# Patient Record
Sex: Female | Born: 1950
Health system: Southern US, Community
[De-identification: ages and names within clinical notes are randomized; demographics above are authoritative.]

## PROBLEM LIST (undated history)

## (undated) DIAGNOSIS — N183 Chronic kidney disease, stage 3 unspecified: Secondary | ICD-10-CM

## (undated) DIAGNOSIS — E079 Disorder of thyroid, unspecified: Secondary | ICD-10-CM

## (undated) DIAGNOSIS — L719 Rosacea, unspecified: Secondary | ICD-10-CM

## (undated) HISTORY — DX: Rosacea, unspecified: L71.9

## (undated) HISTORY — PX: OTHER SURGICAL HISTORY: SHX169

## (undated) HISTORY — DX: Chronic kidney disease, stage 3 (moderate): N18.3

## (undated) HISTORY — DX: Chronic kidney disease, stage 3 unspecified: N18.30

## (undated) HISTORY — PX: PILONIDAL CYST / SINUS EXCISION: SUR543

---

## 1998-04-07 ENCOUNTER — Ambulatory Visit (HOSPITAL_COMMUNITY): Admission: RE | Admit: 1998-04-07 | Discharge: 1998-04-07 | Payer: Self-pay | Admitting: Obstetrics and Gynecology

## 1998-04-07 ENCOUNTER — Other Ambulatory Visit: Admission: RE | Admit: 1998-04-07 | Discharge: 1998-04-07 | Payer: Self-pay | Admitting: Obstetrics and Gynecology

## 1998-08-10 ENCOUNTER — Other Ambulatory Visit: Admission: RE | Admit: 1998-08-10 | Discharge: 1998-08-10 | Payer: Self-pay | Admitting: Obstetrics and Gynecology

## 1999-02-13 ENCOUNTER — Other Ambulatory Visit: Admission: RE | Admit: 1999-02-13 | Discharge: 1999-02-13 | Payer: Self-pay | Admitting: Obstetrics and Gynecology

## 1999-06-14 ENCOUNTER — Ambulatory Visit (HOSPITAL_COMMUNITY): Admission: RE | Admit: 1999-06-14 | Discharge: 1999-06-14 | Payer: Self-pay | Admitting: Obstetrics and Gynecology

## 1999-06-14 ENCOUNTER — Encounter: Payer: Self-pay | Admitting: Obstetrics and Gynecology

## 2000-02-14 ENCOUNTER — Other Ambulatory Visit: Admission: RE | Admit: 2000-02-14 | Discharge: 2000-02-14 | Payer: Self-pay | Admitting: Obstetrics and Gynecology

## 2000-08-22 ENCOUNTER — Encounter: Payer: Self-pay | Admitting: Family Medicine

## 2000-08-22 ENCOUNTER — Ambulatory Visit (HOSPITAL_COMMUNITY): Admission: RE | Admit: 2000-08-22 | Discharge: 2000-08-22 | Payer: Self-pay | Admitting: Family Medicine

## 2001-08-27 ENCOUNTER — Encounter: Payer: Self-pay | Admitting: Family Medicine

## 2001-08-27 ENCOUNTER — Ambulatory Visit (HOSPITAL_COMMUNITY): Admission: RE | Admit: 2001-08-27 | Discharge: 2001-08-27 | Payer: Self-pay | Admitting: Family Medicine

## 2002-09-10 ENCOUNTER — Ambulatory Visit (HOSPITAL_COMMUNITY): Admission: RE | Admit: 2002-09-10 | Discharge: 2002-09-10 | Payer: Self-pay | Admitting: Family Medicine

## 2002-09-10 ENCOUNTER — Encounter: Payer: Self-pay | Admitting: Family Medicine

## 2003-07-07 ENCOUNTER — Other Ambulatory Visit: Admission: RE | Admit: 2003-07-07 | Discharge: 2003-07-07 | Payer: Self-pay | Admitting: Gynecology

## 2003-07-12 ENCOUNTER — Encounter: Admission: RE | Admit: 2003-07-12 | Discharge: 2003-07-12 | Payer: Self-pay | Admitting: Gynecology

## 2003-07-12 ENCOUNTER — Encounter: Payer: Self-pay | Admitting: Gynecology

## 2004-07-26 ENCOUNTER — Encounter: Admission: RE | Admit: 2004-07-26 | Discharge: 2004-07-26 | Payer: Self-pay | Admitting: Family Medicine

## 2004-08-15 ENCOUNTER — Other Ambulatory Visit: Admission: RE | Admit: 2004-08-15 | Discharge: 2004-08-15 | Payer: Self-pay | Admitting: Family Medicine

## 2004-09-04 ENCOUNTER — Ambulatory Visit (HOSPITAL_COMMUNITY): Admission: RE | Admit: 2004-09-04 | Discharge: 2004-09-04 | Payer: Self-pay | Admitting: Surgery

## 2004-09-04 ENCOUNTER — Encounter (INDEPENDENT_AMBULATORY_CARE_PROVIDER_SITE_OTHER): Payer: Self-pay | Admitting: Specialist

## 2004-11-16 ENCOUNTER — Encounter: Admission: RE | Admit: 2004-11-16 | Discharge: 2004-11-16 | Payer: Self-pay | Admitting: Family Medicine

## 2004-12-12 ENCOUNTER — Encounter (INDEPENDENT_AMBULATORY_CARE_PROVIDER_SITE_OTHER): Payer: Self-pay | Admitting: Specialist

## 2004-12-12 ENCOUNTER — Observation Stay (HOSPITAL_COMMUNITY): Admission: RE | Admit: 2004-12-12 | Discharge: 2004-12-13 | Payer: Self-pay | Admitting: Surgery

## 2005-07-19 ENCOUNTER — Other Ambulatory Visit: Admission: RE | Admit: 2005-07-19 | Discharge: 2005-07-19 | Payer: Self-pay | Admitting: Gynecology

## 2005-07-29 ENCOUNTER — Encounter: Admission: RE | Admit: 2005-07-29 | Discharge: 2005-07-29 | Payer: Self-pay | Admitting: Gynecology

## 2006-08-13 ENCOUNTER — Encounter: Admission: RE | Admit: 2006-08-13 | Discharge: 2006-08-13 | Payer: Self-pay | Admitting: Gynecology

## 2006-08-14 ENCOUNTER — Other Ambulatory Visit: Admission: RE | Admit: 2006-08-14 | Discharge: 2006-08-14 | Payer: Self-pay | Admitting: Gynecology

## 2006-11-11 HISTORY — PX: THYROID SURGERY: SHX805

## 2007-02-06 ENCOUNTER — Encounter: Payer: Self-pay | Admitting: Emergency Medicine

## 2007-02-06 ENCOUNTER — Observation Stay (HOSPITAL_COMMUNITY): Admission: AD | Admit: 2007-02-06 | Discharge: 2007-02-07 | Payer: Self-pay | Admitting: Internal Medicine

## 2007-08-27 ENCOUNTER — Other Ambulatory Visit: Admission: RE | Admit: 2007-08-27 | Discharge: 2007-08-27 | Payer: Self-pay | Admitting: Gynecology

## 2007-10-28 ENCOUNTER — Encounter: Admission: RE | Admit: 2007-10-28 | Discharge: 2007-10-28 | Payer: Self-pay | Admitting: Gynecology

## 2008-10-19 ENCOUNTER — Encounter: Payer: Self-pay | Admitting: Women's Health

## 2008-10-19 ENCOUNTER — Other Ambulatory Visit: Admission: RE | Admit: 2008-10-19 | Discharge: 2008-10-19 | Payer: Self-pay | Admitting: Gynecology

## 2008-10-19 ENCOUNTER — Ambulatory Visit: Payer: Self-pay | Admitting: Women's Health

## 2008-10-28 ENCOUNTER — Encounter: Admission: RE | Admit: 2008-10-28 | Discharge: 2008-10-28 | Payer: Self-pay | Admitting: Gynecology

## 2009-07-06 ENCOUNTER — Ambulatory Visit: Payer: Self-pay | Admitting: Women's Health

## 2009-10-20 ENCOUNTER — Other Ambulatory Visit: Admission: RE | Admit: 2009-10-20 | Discharge: 2009-10-20 | Payer: Self-pay | Admitting: Gynecology

## 2009-10-20 ENCOUNTER — Ambulatory Visit: Payer: Self-pay | Admitting: Women's Health

## 2009-10-30 ENCOUNTER — Encounter: Admission: RE | Admit: 2009-10-30 | Discharge: 2009-10-30 | Payer: Self-pay | Admitting: Gynecology

## 2010-11-16 ENCOUNTER — Encounter
Admission: RE | Admit: 2010-11-16 | Discharge: 2010-11-16 | Payer: Self-pay | Source: Home / Self Care | Attending: Obstetrics and Gynecology | Admitting: Obstetrics and Gynecology

## 2010-11-16 ENCOUNTER — Other Ambulatory Visit
Admission: RE | Admit: 2010-11-16 | Discharge: 2010-11-16 | Payer: Self-pay | Source: Home / Self Care | Admitting: Obstetrics and Gynecology

## 2010-12-01 ENCOUNTER — Encounter: Payer: Self-pay | Admitting: Family Medicine

## 2011-03-29 NOTE — Op Note (Signed)
NAME:  Kathy [name], Kathy [name]               ACCOUNT NO.:  [id number]   MEDICAL RECORD NO.:  [id number]          PATIENT TYPE:  AMB   LOCATION:  DAY                          FACILITY:  [facility]   PHYSICIAN:  Thornton Park. Daphine Deutscher, MD  DATE OF BIRTH:  06/25/1951   DATE OF PROCEDURE:  12/12/2004  DATE OF DISCHARGE:                                 OPERATIVE REPORT   PREOPERATIVE DIAGNOSIS:  Left thyroid nodule found by Dr. Douglass Rivers on  physical exam.  Needle aspirate suggested follicular neoplasm.   POSTOPERATIVE DIAGNOSIS:  Left thyroid lobe containing follicular neoplasm,  no evidence of papillary carcinoma, final pathology report pending.   SURGEON:  Thornton Park. Daphine Deutscher, M.D.   ASSISTANT:  Gita Kudo, M.D.   ANESTHESIA:  General endotracheal.   DESCRIPTION OF PROCEDURE:  Kathy [name] is a 60 year old white female with  the above-mentioned diagnosis.  Informed consent was obtained regarding left  thyroid lobectomy, possible near total, depending on her pathology.  She was  taken to room 1, where the left side was identified.  She was marked,  prepped, draped.  A transverse curvilinear incision was made a couple of  centimeters above the sternal notch, and the platysma muscle was divided.  Flaps were elevated below the platysma superiorly and inferiorly, and a  Mahorner retractor was inserted.  The midline was identified, and the strap  muscles were divided, and then I retracted the left strap muscles laterally.  I dissected along the anterior border of the thyroid, easily palpating the  nodule.  She had a small lobe that was pretty much replaced by the nodule.  I identified the superior polar vessels and took those right against the  gland, identified the inferior thyroid vessels, and took them right near the  gland, putting clips and ties as needed to secure branches.  I then swept  this anteriorly, identifying the tracheoesophageal groove and the recurrent  laryngeal nerve and stayed  on the gland, elevating it.  At [facility] ligament,  I took this, leaving a trace of thyroid capsule as I took the thyroid off  the Berry's ligament.  In doing so, I think I preserved both the superior  and inferior parathyroid glands.  This was then swept to the midline, where  it was divided at the isthmus.  Bleeding was controlled with 4-0 Vicryl  sutures and cautery.   I inspected the area and irrigated, and no active bleeding was seen.  Surgicel was placed where the lobe had been removed.  Strap muscles were  approximated with 4-0 Vicryl.  No bleeding was seen.  The platysma was  approximated with 4-0 Vicryl subcutaneously, and then the wound was closed  with 5-0 Vicryl subcuticularly, with Benzoin and Steri-Strips on the skin.  The patient seemed to tolerate the procedure well and was taken to the  recovery room in satisfactory condition.      MBM/MEDQ  D:  12/12/2004  T:  12/12/2004  Job:  [id number]   cc:   Holley Bouche, M.D.  [address]  [place], [place] [address]  Fax: [phone]  Ivor Costa. Farrel Gobble, M.D.  [address], [address]  [place]  [place] [address]  Fax: [phone]

## 2011-03-29 NOTE — Consult Note (Signed)
Kathy [name], Kathy [name]               ACCOUNT NO.:  [id number]   MEDICAL RECORD NO.:  [id number]          PATIENT TYPE:  OBV   LOCATION:  3711                         FACILITY:  [facility]   PHYSICIAN:  Armanda Magic, M.D.     DATE OF BIRTH:  17-Jul-1951   DATE OF CONSULTATION:  02/06/2007  DATE OF DISCHARGE:                                 CONSULTATION   REFERRING PHYSICIAN:  Barnetta Chapel, M.D.   PRIMARY PHYSICIAN:  Holley Bouche, M.D.   CHIEF COMPLAINT:  Chest pain.   This is a 60 year old female who developed substernal chest pain and  dyspnea after eating dinner last night.  She said initially she had a  sensation that food might be stuck in her throat.  She then developed a  pressure sensation in her chest lasting at least 2 hours, associated  with some shortness of breath and it did radiate up into her right neck.  When she got to the ER she continued to have pain and then the pain  resolved after placement on oxygen.  She says she does not think she was  given nitroglycerin.  She denies any other history of chest pain, but  she has had problems with feeling bloated in the past and did feel  bloated after her meal last night like her stomach was swollen.   PAST MEDICAL HISTORY:  Includes dyslipidemia, hypothyroidism.  Apparently her lipids back in June were a total cholesterol of 380 and  she was recommended to start on Vytorin.  She tried it and it caused  severe nausea and vomiting.  She was switched to Crestor which she said  she really did not like either, and she has not been on any medications  since.   FAMILY HISTORY:  No history of coronary artery disease except for an  uncle at her age.   SOCIAL HISTORY:  She denies any tobacco or alcohol use.  No IV drug use.  There is a rare occasion she drinks wine.  She is married.  She works as  a Clinical cytogeneticist.   ALLERGIES:  NONE.   MEDICATIONS:  Currently none.  At home she takes Levoxyl 75 mcg daily.   REVIEW OF  SYSTEMS:  Other than what is stated in HPI is negative.   PHYSICAL EXAMINATION:  Blood pressure is 128/70, pulse 71, respirations  20, O2 saturations 94% on room air.  HEENT:  Benign.  NECK:  Supple without lymphadenopathy.  Carotid upstrokes +2  bilaterally, no bruits.  LUNGS:  Clear to auscultation throughout.  HEART:  Regular rate and rhythm.  No murmurs, rubs or gallops.  Normal  S1 S2.  ABDOMEN:  Benign.  EXTREMITIES:  No edema.   LABORATORY:  Sodium 140, potassium 4.2, chloride 94, bicarb 27, BUN 7,  creatinine 1, glucose 119.  White cell count 6.2, hemoglobin 13.8,  hematocrit 39, platelet count 247.  EKG shows sinus rhythm at 82 beats  per minute with nonspecific ST changes.  Chest x-ray nonacute.  CPK 198,  MB 2.1, troponin 0.02.  Point of care markers x2 were negative.  Total  cholesterol 344, triglycerides 353, LDL 224, HDL 49.   ASSESSMENT:  1. Chest pain syndrome, somewhat atypical and sounds somewhat      consistent with a GI etiology with epigastric fullness as well but      concerning for underlying ischemia given her risk factors including      postmenopausal state for over 8 years as well as significantly      elevated cholesterol at 344 with an LDL of 224 that is not      currently treated.  2. Dyslipidemia.  Her lipids in the hospital on this admission showed      a total cholesterol of 344, triglycerides 353, LDL 224, HDL 49.  3. Hypothyroidism.   PLAN:  1. Check a TSH to make sure her thyroid is repleted which can also      lead to dyslipidemia.  2. Check one more set of cardiac enzymes.  3. Check a D-dimer.  4. Stress Cardiolite study in the a.m. to rule out ischemia if her D-      dimer is negative.      Armanda Magic, M.D.  Electronically Signed     TT/MEDQ  D:  02/06/2007  T:  02/06/2007  Job:  [id number]   cc:   Holley Bouche, M.D.

## 2011-03-29 NOTE — H&P (Signed)
Kathy Kathy [name], Kathy [name] NO.:  [id number]   MEDICAL RECORD NO.:  [id number]          PATIENT TYPE:  EMS   LOCATION:  ED                           FACILITY:  [facility]   PHYSICIAN:  Hollice Espy, M.D.DATE OF BIRTH:  1951-03-25   DATE OF ADMISSION:  02/06/2007  DATE OF DISCHARGE:                              HISTORY & PHYSICAL   PCP:  Dr. Leonides Sake.   CHIEF COMPLAINT:  Chest tightness.   HISTORY OF PRESENT ILLNESS:  The patient is a 60 year old white female  with past medical history of hypothyroidism and hyperlipidemia who  presents to the emergency room after an episode of chest tightness.  She  has previously been well with no previous episodes when, starting today  about 7 p.m., she had an episode of mid epigastric to midsternal chest  tightness.  She said it radiated to the right side of her neck as well  as caused some associated shortness of breath and some mild  lightheadedness.  The episode lasted about 2 to 3 hours and started to  slightly improve on its own.  By the time she got to the emergency room  and was given aspirin and nitroglycerin, her symptoms significantly  improved to the point now where she is not having any chest tightness at  all.  She currently is doing well.   REVIEW OF SYSTEMS:  She denies any headaches, visual changes, dysphagia,  chest pain or palpitations, shortness of breath, wheeze, cough,  abdominal pain, hematuria, dysuria, constipation, diarrhea, focal  extremity numbness, weakness, or pain.  Her review of systems is  otherwise negative.   Her EKG and other lab work done in the emergency room was unremarkable.   PAST MEDICAL HISTORY:  Includes:  1. Hypothyroidism.  2. Hyperlipidemia.   MEDICATIONS:  She is on Levoxyl 75 mcg p.o. daily.  She was supposed to  be on medication for her hyperlipidemia but has not been taking any.  Her lipids were last checked in June 2007.   ALLERGIES:  She has no known drug  allergies.   SOCIAL HISTORY:  She denies any tobacco or alcohol or drug use.   FAMILY HISTORY:  Negative for any CAD.   PHYSICAL EXAMINATION:  VITAL SIGNS:  On admission, temp 97.7, heart rate  80, blood pressure 151/91 and now down to 144/58, respirations 20.  O2  sat 98% on 2 L.  GENERAL:  The patient is alert, oriented x3, in no apparent distress.  HEENT:  Normocephalic, atraumatic.  Her mucous membranes are moist.  NECK:  She has no carotid bruits.  HEART:  Regular rate and rhythm, S1, S2.  LUNGS:  Clear to auscultation bilaterally.  ABDOMEN:  Soft, nontender, nondistended.  Positive bowel sounds.  EXTREMITIES:  Show no clubbing, cyanosis, or edema.   LAB WORK:  Sodium 140, potassium 4.2, chloride 94, bicarb 28, BUN 7,  creatinine 1.04, glucose of 119.  CBC normal.  Coags normal.  First set  of cardiac markers shows CPK 51.6 and second at 41.3; MB 1.8 on both  sets, troponin I on both sets is  less than 0.05.  EKG is not present on  chart but reported to be normal sinus rhythm.   ASSESSMENT AND PLAN:  1. Atypical chest pain.  Given the patient's advanced age and      uncontrolled lipids, we will need to keep her in the hospital to      rule out cardiac issue.  We will plan to check 2 more sets of      cardiac markers and then put the patient for a stress test.  If      this is negative, she can likely go home after this.  2. Hypothyroidism.  Continue levoxyl after she is p.o. after stress      test.  3. Hyperlipidemia.  We will recheck her lipids.  She may need to be      recommended to be started on a lipid-lowering agent.      Hollice Espy, M.D.  Electronically Signed     SKK/MEDQ  D:  02/06/2007  T:  02/06/2007  Job:  [id number]   cc:   Holley Bouche, M.D.  Fax: [phone]   [facility]

## 2011-03-29 NOTE — Discharge Summary (Signed)
NAMEJAZMYNN, PHO               ACCOUNT NO.:  [id number]   MEDICAL RECORD NO.:  [id number]          PATIENT TYPE:  OBV   LOCATION:  3711                         FACILITY:  [facility]   PHYSICIAN:  Barnetta Chapel, MDDATE OF BIRTH:  May 27, 1951   DATE OF ADMISSION:  02/06/2007  DATE OF DISCHARGE:  02/07/2007                               DISCHARGE SUMMARY   PRIMARY CARE Mikhaila Roh:  Dr. Leonides Sake.   ADMITTING DIAGNOSES:  1. Atypical chest pain.  2. Hypothyroidism.  3. Hyperlipidemia.   DISCHARGE DIAGNOSES:  Same as above.   DISCHARGE MEDICATIONS:  1. Crestor 10 mg p.o. once daily.  2. Levoxyl 75 mcg p.o. once daily.   CONSULTATIONS DONE:  Cardiology consult.  The patient was seen by the  cardiologist for chest pain.  Cardiologist recommended adenosine  Cardiolite.  This was said to be negative.  The official report is still  pending.   BRIEF HISTORY AND HOSPITAL COURSE:  Please refer to the H&P done on  February 06, 2007.  The patient is a 60 year old female with past medical  history significant for hypothyroidism and hyperlipidemia.  The patient  presented with one day history of pressure-like chest pain radiating to  the right side of the neck.  EKG was normal.  Cardiac enzymes were  normal.  The patient was admitted to the Telemetry Floor for further  workup.  A Cardiology consult was called and adenosine Cardiolite was  recommended.  This was done earlier on this morning and a call from the  Nuclear Medicine indicates that the cardiac stress test was negative.  Lipid profile revealed a total cholesterol of 344, LDL of 224,  triglyceride of 353, and HDL of 49.  The patient will be started on  Crestor 10 mg p.o. once daily.  Patient's chest pain has resolved.  Vital signs are all stable.  She will be discharged back home today.   DISCHARGE PLANS:  1. Discharge patient home today.  2. Low fat diet.  3. Activity as tolerated.  4. Regular exercise.  5. Follow up with the  primary care Alwaleed Obeso in a week.      Barnetta Chapel, MD  Electronically Signed     SIO/MEDQ  D:  02/07/2007  T:  02/07/2007  Job:  [id number]   cc:   Holley Bouche, M.D.

## 2012-03-20 ENCOUNTER — Other Ambulatory Visit: Payer: Self-pay | Admitting: Family Medicine

## 2012-03-20 DIAGNOSIS — Z1231 Encounter for screening mammogram for malignant neoplasm of breast: Secondary | ICD-10-CM

## 2012-03-21 ENCOUNTER — Emergency Department (HOSPITAL_COMMUNITY): Payer: Federal, State, Local not specified - PPO

## 2012-03-21 ENCOUNTER — Emergency Department (HOSPITAL_COMMUNITY)
Admission: EM | Admit: 2012-03-21 | Discharge: 2012-03-21 | Disposition: A | Discharge status: Home / Self Care | Payer: Federal, State, Local not specified - PPO | Attending: Emergency Medicine | Admitting: Emergency Medicine

## 2012-03-21 ENCOUNTER — Encounter (HOSPITAL_COMMUNITY): Payer: Self-pay | Admitting: *Deleted

## 2012-03-21 DIAGNOSIS — E785 Hyperlipidemia, unspecified: Secondary | ICD-10-CM | POA: Insufficient documentation

## 2012-03-21 DIAGNOSIS — R002 Palpitations: Secondary | ICD-10-CM | POA: Insufficient documentation

## 2012-03-21 DIAGNOSIS — E079 Disorder of thyroid, unspecified: Secondary | ICD-10-CM | POA: Insufficient documentation

## 2012-03-21 DIAGNOSIS — Z79899 Other long term (current) drug therapy: Secondary | ICD-10-CM | POA: Insufficient documentation

## 2012-03-21 DIAGNOSIS — R079 Chest pain, unspecified: Secondary | ICD-10-CM | POA: Insufficient documentation

## 2012-03-21 HISTORY — DX: Disorder of thyroid, unspecified: E07.9

## 2012-03-21 LAB — DIFFERENTIAL
Basophils Absolute: 0 10*3/uL (ref 0.0–0.1)
Basophils Relative: 0 % (ref 0–1)
Eosinophils Absolute: 0.2 10*3/uL (ref 0.0–0.7)
Eosinophils Relative: 3 % (ref 0–5)
Lymphocytes Relative: 30 % (ref 12–46)
Lymphs Abs: 2.5 10*3/uL (ref 0.7–4.0)
Monocytes Absolute: 0.5 10*3/uL (ref 0.1–1.0)
Monocytes Relative: 6 % (ref 3–12)
Neutro Abs: 5.2 10*3/uL (ref 1.7–7.7)
Neutrophils Relative %: 61 % (ref 43–77)

## 2012-03-21 LAB — POCT I-STAT TROPONIN I: Troponin i, poc: 0 ng/mL (ref 0.00–0.08)

## 2012-03-21 LAB — CBC
Hemoglobin: 14.1 g/dL (ref 12.0–15.0)
Platelets: 240 10*3/uL (ref 150–400)
RBC: 4.43 MIL/uL (ref 3.87–5.11)
WBC: 8.5 10*3/uL (ref 4.0–10.5)

## 2012-03-21 LAB — POCT I-STAT, CHEM 8
BUN: 10 mg/dL (ref 6–23)
Calcium, Ion: 1.16 mmol/L (ref 1.12–1.32)
Chloride: 105 meq/L (ref 96–112)
Creatinine, Ser: 1 mg/dL (ref 0.50–1.10)
Glucose, Bld: 130 mg/dL — ABNORMAL HIGH (ref 70–99)
HCT: 42 % (ref 36.0–46.0)
Hemoglobin: 14.3 g/dL (ref 12.0–15.0)
Potassium: 4.2 meq/L (ref 3.5–5.1)
Sodium: 140 meq/L (ref 135–145)
TCO2: 25 mmol/L (ref 0–100)

## 2012-03-21 LAB — D-DIMER, QUANTITATIVE: D-Dimer, Quant: 0.26 ug{FEU}/mL (ref 0.00–0.48)

## 2012-03-21 NOTE — ED Notes (Signed)
C/o CP and heart fluttering and skipping, onset Thursday night,came and went, returned worse tonight, also diaphoresis and some back pain, (denies: nvd, fever, sob, dizziness, weakness or other sx), h/o similar several years ago.

## 2012-03-21 NOTE — Discharge Instructions (Signed)
Palpitations  A palpitation is the feeling that your heartbeat is irregular or is faster than normal. Although this is frightening, it usually is not serious. Palpitations may be caused by excesses of smoking, caffeine, or alcohol. They are also brought on by stress and anxiety. Sometimes, they are caused by heart disease. Unless otherwise noted, your caregiver did not find any signs of serious illness at this time. HOME CARE INSTRUCTIONS  To help prevent palpitations:  Drink decaffeinated coffee, tea, and soda pop. Avoid chocolate.   If you smoke or drink alcohol, quit or cut down as much as possible.   Reduce your stress or anxiety level. Biofeedback, yoga, or meditation will help you relax. Physical activity such as swimming, jogging, or walking also may be helpful.  SEEK MEDICAL CARE IF:   You continue to have a fast heartbeat.   Your palpitations occur more often.  SEEK IMMEDIATE MEDICAL CARE IF: You develop chest pain, shortness of breath, severe headache, dizziness, or fainting. Document Released: 10/25/2000 Document Revised: 10/17/2011 Document Reviewed: 12/25/2007 Exit[employer] Patient Information 2012 [employer]. 

## 2012-03-21 NOTE — ED Provider Notes (Signed)
History     CSN: [id number]  Arrival date & time 03/21/12  0154   First MD Initiated Contact with Patient 03/21/12 [phone]      Chief Complaint  Patient presents with  . Chest Pain    (Consider location/radiation/quality/duration/timing/severity/associated sxs/prior treatment) Patient is a 61 y.o. female presenting with chest pain. The history is provided by the patient. No language interpreter was used.  Chest Pain The chest pain began 6 - 12 hours ago. Chest pain occurs constantly. The chest pain is resolved. Associated with: nothing. The severity of the pain is mild. The quality of the pain is described as dull. The pain does not radiate. Exacerbated by: nothing. Primary symptoms include palpitations.  Pertinent negatives for associated symptoms include no lower extremity edema. She tried nothing for the symptoms. Risk factors include being elderly.  Pertinent negatives for past medical history include no cancer.  Pertinent negatives for family medical history include: no Marfan's syndrome in family.  Procedure history is negative for cardiac catheterization.     Past Medical History  Diagnosis Date  . Hyperlipidemia   . Thyroid disease     Past Surgical History  Procedure Date  . Thyroid surgery     Family History  Problem Relation Age of Onset  . Stroke Other   . Heart failure Other     History  Substance Use Topics  . Smoking status: Never Smoker   . Smokeless tobacco: Not on file  . Alcohol Use: Yes    OB History    Grav Para Term Preterm Abortions TAB SAB Ect Mult Living                  Review of Systems  Cardiovascular: Positive for chest pain and palpitations.  All other systems reviewed and are negative.    Allergies  Review of patient's allergies indicates no known allergies.  Home Medications   Current Outpatient Rx  Name Route Sig Dispense Refill  . ASPIRIN EC 81 MG PO TBEC Oral Take 81 mg by mouth daily.    . OMEGA-3 FATTY ACIDS 1000  MG PO CAPS Oral Take 4 g by mouth daily.    [name] LEVOTHYROXINE SODIUM 88 MCG PO TABS Oral Take 88 mcg by mouth daily.    Carma Leaven M PLUS PO TABS Oral Take 1 tablet by mouth daily.    [name] ROSUVASTATIN CALCIUM 10 MG PO TABS Oral Take 10 mg by mouth daily.    [name] VITAMIN D (CHOLECALCIFEROL) PO Oral Take 1 tablet by mouth daily.      BP 120/67  Pulse 76  Temp(Src) 98.4 F (36.9 C) (Oral)  Resp 14  SpO2 94%  Physical Exam  Constitutional: She is oriented to person, place, and time. She appears well-developed and well-nourished.  HENT:  Head: Normocephalic and atraumatic.  Mouth/Throat: Oropharynx is clear and moist.  Eyes: Conjunctivae are normal. Pupils are equal, round, and reactive to light.  Neck: Normal range of motion. Neck supple.  Cardiovascular: Normal rate and regular rhythm.   Pulmonary/Chest: Effort normal and breath sounds normal.  Abdominal: Soft. Bowel sounds are normal. There is no tenderness. There is no rebound and no guarding.  Musculoskeletal: Normal range of motion.  Neurological: She is alert and oriented to person, place, and time.  Skin: Skin is warm and dry.  Psychiatric: She has a normal mood and affect.    ED Course  Procedures (including critical care time)  Labs Reviewed  POCT I-STAT, CHEM 8 -  Abnormal; Notable for the following:    Glucose, Bld 130 (*)    All other components within normal limits  CBC  DIFFERENTIAL  POCT I-STAT TROPONIN I  D-DIMER, QUANTITATIVE  POCT I-STAT TROPONIN I   Dg Chest 2 View  03/21/2012  *RADIOLOGY REPORT*  Clinical Data: Chest pain on and off since Thursday.  CHEST - 2 VIEW  Comparison: 02/06/2007  Findings: Normal heart size and pulmonary vascularity.  No focal airspace consolidation in the lungs.  No blunting of costophrenic angles.  No pneumothorax.  No significant changes since the previous study.  IMPRESSION: No evidence of active pulmonary disease.  Original Report Authenticated By: Marlon Pel, M.D.     1.  Palpitations       MDM   Date: 03/21/2012  Rate:94  Rhythm: normal sinus rhythm  QRS Axis: normal  Intervals: normal  ST/T Wave abnormalities: normal and nonspecific ST changes  Conduction Disutrbances:none  Narrative Interpretation: low voltage  Old EKG Reviewed: none available         Collin Rengel K Asanti Craigo-Rasch, MD 03/21/12 [phone]

## 2012-03-26 ENCOUNTER — Ambulatory Visit
Admission: RE | Admit: 2012-03-26 | Discharge: 2012-03-26 | Disposition: A | Discharge status: Home / Self Care | Payer: Federal, State, Local not specified - PPO | Source: Ambulatory Visit | Attending: Family Medicine | Admitting: Family Medicine

## 2012-03-26 DIAGNOSIS — Z1231 Encounter for screening mammogram for malignant neoplasm of breast: Secondary | ICD-10-CM

## 2012-04-23 ENCOUNTER — Encounter: Payer: Self-pay | Admitting: Women's Health

## 2012-04-23 DIAGNOSIS — M8580 Other specified disorders of bone density and structure, unspecified site: Secondary | ICD-10-CM | POA: Insufficient documentation

## 2012-04-29 ENCOUNTER — Ambulatory Visit (INDEPENDENT_AMBULATORY_CARE_PROVIDER_SITE_OTHER): Payer: Federal, State, Local not specified - PPO | Admitting: Women's Health

## 2012-04-29 ENCOUNTER — Encounter: Payer: Self-pay | Admitting: Women's Health

## 2012-04-29 VITALS — BP 150/90 | Ht 65.0 in | Wt 186.0 lb

## 2012-04-29 DIAGNOSIS — Z01419 Encounter for gynecological examination (general) (routine) without abnormal findings: Secondary | ICD-10-CM

## 2012-04-29 DIAGNOSIS — E7800 Pure hypercholesterolemia, unspecified: Secondary | ICD-10-CM | POA: Insufficient documentation

## 2012-04-29 DIAGNOSIS — E039 Hypothyroidism, unspecified: Secondary | ICD-10-CM | POA: Insufficient documentation

## 2012-04-29 NOTE — Patient Instructions (Addendum)
zostavac vaccineHealth Recommendations for Postmenopausal Women Based on the Results of the Women's Health Initiative [facility]) and Other Studies The WHI is a major 15-year research program to address the most common causes of death, disability and poor quality of life in postmenopausal women. Some of these causes are heart disease, cancer, bone loss (osteoporosis) and others. Taking into account all of the findings from [facility] and other studies, here are bottom-line health recommendations for women: CARDIOVASCULAR DISEASE Heart Disease: A heart attack is a medical emergency. Know the signs and symptoms of a heart attack. Hormone therapy should not be used to prevent heart disease. In women with heart disease, hormone therapy should not be used to prevent further disease. Hormone therapy increases the risk of blood clots. Below are things women can do to reduce their risk for heart disease.   Do not smoke. If you smoke, quit. Women who smoke are 2 to 6 times more likely to suffer a heart attack than non-smoking women.   Aim for a healthy weight. Being overweight causes many preventable deaths. Eat a healthy and balanced diet and drink an adequate amount of liquids.   Get moving. Make a commitment to be more physically active. Aim for 30 minutes of activity on most, if not all days of the week.   Eat for heart health. Choose a diet that is low in saturated fat, trans fat, and cholesterol. Include whole grains, vegetables, and fruits. Read the labels on the food container before buying it.   Know your numbers. Ask your caregiver to check your blood pressure, cholesterol (total, HDL, LDL, triglycerides) and blood glucose. Work with your caregiver to improve any numbers that are not normal.   High blood pressure. Limit or stop your table salt intake (try salt substitute and food seasonings), avoid salty foods and drinks. Read the labels on the food container before buying it. Avoid becoming overweight by eating  well and exercising.  STROKE  Stroke is a medical emergency. Stroke can be the result of a blood clot in the blood vessel in the brain or by a brain hemorrhage (bleeding). Know the signs and symptoms of a stroke. To lower the risk of developing a stroke:  Avoid fatty foods.   Quit smoking.   Control your diabetes, blood pressure, and irregular heart rate.  THROMBOPHLIBITIS (BLOOD CLOT) OF THE LEG  Hormone treatment is a big cause of developing blood clots in the leg. Becoming overweight and leading a stationary lifestyle also may contribute to developing blood clots. Controlling your diet and exercising will help lower the risk of developing blood clots. CANCER SCREENING  Breast Cancer: Women should take steps to reduce their risk of breast cancer. This includes having regular mammograms, monthly self breast exams and regular breast exams by your caregiver. Have a mammogram every one to two years if you are 67 to 63 years old. Have a mammogram annually if you are 22 years old or older depending on your risk factors. Women who are high risk for breast cancer may need more frequent mammograms. There are tests available (testing the genes in your body) if you have family history of breast cancer called BRCA 1 and 2. These tests can help determine the risks of developing breast cancer.   Intestinal or Stomach Cancer: Women should talk to their caregiver about when to start screening, what tests and how often they should be done, and the benefits and risks of doing these tests. Tests to consider are a rectal exam,  fecal occult blood, sigmoidoscopy, colononoscoby, barium enema and upper GI series of the stomach. Depending on the age, you may want to get a medical and family history of colon cancer. Women who are high risk may need to be screened at an earlier age and more often.   Cervical Cancer: A Pap test of the cervix should be done every year and every 3 years when there has been three straight years  of a normal Pap test. Women with an abnormal Pap test should be screened more often or have a cervical biopsy depending on your caregiver's recommendation.   Uterine Cancer: If you have vaginal bleeding after you are in the menopause, it should be evaluated by your caregiver.   Ovarian cancer: There are no reliable tests available to screen for ovarian cancer at this time except for yearly pelvic exams.   Lung Cancer: Yearly chest X-rays can detect lung cancer and should be done on high risk women, such as cigarette smokers and women with chronic lung disease (emphysemia).   Skin Cancer: A complete body skin exam should be done at your yearly examination. Avoid overexposure to the sun and ultraviolet light lamps. Use a strong sun block cream when in the sun. All of these things are important in lowering the risk of skin cancer.  MENOPAUSE Menopause Symptoms: Hormone therapy products are effective for treating symptoms associated with menopause:  Moderate to severe hot flashes.   Night sweats.   Mood swings.   Headaches.   Tiredness.   Loss of sex drive.   Insomnia.   Other symptoms.  However, hormone therapy products carry serious risks, especially in older women. Women who use or are thinking about using estrogen or estrogen with progestin treatments should discuss that with their caregiver. Your caregiver will know if the benefits outweigh the risks. The Food and Drug Administration (FDA) has concluded that hormone therapy should be used only at the lowest doses and for the shortest amount of time to reach treatment goals. It is not known at what doses there may be less risk of serious side effects. There are other treatments such as herbal medication (not controlled or regulated by the FDA), group therapy, counseling and acupuncture that may be helpful. OSTEOPOROSIS Protecting Against Bone Loss and Preventing Fracture: If hormone therapy is used for prevention of bone loss  (osteoporosis), the risks for bone loss must outweigh the risk of the therapy. Women considering taking hormone therapy for bone loss should ask their health care providers about other medications (fosamax and boniva) that are considered safe and effective for preventing bone loss and bone fractures. To guard against bone loss or fractures, it is recommended that women should take at least 1000-1500 mg of calcium and 400-800 IU of vitamin D daily in divided doses. Smoking and excessive alcohol intake increases the risk of osteoporosis. Eat foods rich in calcium and vitamin D and do weight bearing exercises several times a week as your caregiver suggests. DIABETES Diabetes Melitus: Women with Type I or Type 2 diabetes should keep their diabetes in control with diet, exercise and medication. Avoid too many sweets, starchy and fatty foods. Being overweight can affect your diabetes. COGNITION AND MEMORY Cognition and Memory: Menopausal hormone therapy is not recommended for the prevention of cognitive disorders such as Alzheimer's disease or memory loss. WHI found that women treated with hormone therapy have a greater risk of developing dementia.  DEPRESSION  Depression may occur at any age, but is common in elderly  women. The reasons may be because of physical, medical, social (loneliness), financial and/or economic problems and needs. Becoming involved with church, volunteer or social groups, seeking treatment for any physical or medical problems is recommended. Also, look into getting professional advice for any economic or financial problems. ACCIDENTS  Accidents are common and can be serious in the elderly woman. Prepare your house to prevent accidents. Eliminate throw rugs, use hip protectors, place hand bars in the bath, shower and toilet areas. Avoid wearing high heel shoes and walking on wet, snowy and icy areas. Stop driving if you have vision, hearing problems or are unsteady with you movements and  reflexes. RHEUMATOID ARTHRITIS Rheumatoid arthritis causes pain, swelling and stiffness of your bone joints. It can limit many of your activities. Over-the-counter medications may help, but prescription medications may be necessary. Talk with your caregiver about this. Exercise (walking, water aerobics), good posture, using splints on painful joints, warm baths or applying warm compresses to stiff joints and cold compresses to painful joints may be helpful. Smoking and excessive drinking may worsen the symptoms of arthritis. Seek help from a physical therapist if the arthritis is becoming a problem with your daily activities. IMMUNIZATIONS  Several immunizations are important to have during your senior years, including:   Tetanus and a diptheria shot booster every 10 years.   Influenza every year before the flu season begins.   Pneumonia vaccine.   Shingles vaccine.   Others as indicated (example: H1N1 vaccine).  Document Released: 12/20/2005 Document Revised: 10/17/2011 Document Reviewed: 08/15/2008 [facility] Patient Information 2012 [place], [place].

## 2012-04-29 NOTE — Progress Notes (Signed)
HAILEE HOLLICK [birth year] [id number]    History:    The patient presents for annual exam.  Postmenopausal on no HRT with no bleeding. History of normal Paps and mammograms. History of a normal colonoscopy in 03, scheduled for repeat December 2013. DEXA 2012 at primary care, osteopenia stable. Hypothyroid is some currently on Synthroid 88 mcg daily, and Crestor per hypercholesterolemia.   Past medical history, past surgical history, family history and social history were all reviewed and documented in the EPIC chart. Planning retirement end of year. Had a normal physical exam with labs at [place] this year.      ROS:  A  ROS was performed and pertinent positives and negatives are included in the history.  Exam:  Filed Vitals:   04/29/12 1443  BP: 150/90    General appearance:  Normal Head/Neck:  Normal, without cervical or supraclavicular adenopathy. Thyroid:  Symmetrical, normal in size, without palpable masses or nodularity. Respiratory  Effort:  Normal  Auscultation:  Clear without wheezing or rhonchi Cardiovascular  Auscultation:  Regular rate, without rubs, murmurs or gallops  Edema/varicosities:  Not grossly evident Abdominal  Soft,nontender, without masses, guarding or rebound.  Liver/spleen:  No organomegaly noted  Hernia:  None appreciated  Skin  Inspection:  Grossly normal  Palpation:  Grossly normal Neurologic/psychiatric  Orientation:  Normal with appropriate conversation.  Mood/affect:  Normal  Genitourinary    Breasts: Examined lying and sitting.     Right: Without masses, retractions, discharge or axillary adenopathy.     Left: Without masses, retractions, discharge or axillary adenopathy.   Inguinal/mons:  Normal without inguinal adenopathy  External genitalia:  Normal  BUS/Urethra/Skene's glands:  Normal  Bladder:  Normal  Vagina:  Normal  Cervix:  Normal  Uterus:  normal in size, shape and contour.  Midline and mobile  Adnexa/parametria:      Rt: Without masses or tenderness.   Lt: Without masses or tenderness.  Anus and perineum: Normal  Digital rectal exam: Normal sphincter tone without palpated masses or tenderness  Assessment/Plan:  61 y.o. M WF G2 P2 for annual exam with no complaints.  Normal postmenopausal exam no HRT/no bleeding Hypothyroid-Synthroid 88 mcg-labs and meds primary care Crestor labs and meds primary care Osteopenia managed by primary care  Plan: No Pap, history of normal Paps, new screening recommendations reviewed. SBE's, continue annual mammogram, calcium rich diet, vitamin D 2000 daily, reviewed importance of increasing regular exercise for bone, heart health and stability. Home safety and fall prevention discussed. Reviewed Zostavac vaccine, will get at [facility]. BP up today states has been normal will check away from office.    Harrington Challenger [facility], 4:34 PM 04/29/2012

## 2013-05-12 ENCOUNTER — Other Ambulatory Visit: Payer: Self-pay

## 2013-05-12 DIAGNOSIS — Z1231 Encounter for screening mammogram for malignant neoplasm of breast: Secondary | ICD-10-CM

## 2013-06-22 ENCOUNTER — Ambulatory Visit
Admission: RE | Admit: 2013-06-22 | Discharge: 2013-06-22 | Disposition: A | Discharge status: Home / Self Care | Payer: Federal, State, Local not specified - PPO | Source: Ambulatory Visit

## 2013-06-22 DIAGNOSIS — Z1231 Encounter for screening mammogram for malignant neoplasm of breast: Secondary | ICD-10-CM

## 2013-07-09 ENCOUNTER — Other Ambulatory Visit (HOSPITAL_COMMUNITY)
Admission: RE | Admit: 2013-07-09 | Discharge: 2013-07-09 | Disposition: A | Discharge status: Home / Self Care | Payer: Federal, State, Local not specified - PPO | Source: Ambulatory Visit | Attending: Obstetrics and Gynecology | Admitting: Obstetrics and Gynecology

## 2013-07-09 ENCOUNTER — Other Ambulatory Visit: Payer: Self-pay | Admitting: Obstetrics and Gynecology

## 2013-07-09 DIAGNOSIS — Z01419 Encounter for gynecological examination (general) (routine) without abnormal findings: Secondary | ICD-10-CM | POA: Insufficient documentation

## 2013-07-09 DIAGNOSIS — Z1151 Encounter for screening for human papillomavirus (HPV): Secondary | ICD-10-CM | POA: Insufficient documentation

## 2013-12-29 ENCOUNTER — Encounter (INDEPENDENT_AMBULATORY_CARE_PROVIDER_SITE_OTHER): Payer: Self-pay

## 2013-12-29 ENCOUNTER — Ambulatory Visit (INDEPENDENT_AMBULATORY_CARE_PROVIDER_SITE_OTHER): Payer: Federal, State, Local not specified - PPO | Admitting: *Deleted

## 2013-12-29 DIAGNOSIS — E7800 Pure hypercholesterolemia, unspecified: Secondary | ICD-10-CM

## 2013-12-29 LAB — ALT: ALT: 39 U/L — ABNORMAL HIGH (ref 0–35)

## 2013-12-30 LAB — NMR LIPOPROFILE WITH LIPIDS
Cholesterol, Total: 238 mg/dL — ABNORMAL HIGH (ref <200)
HDL Particle Number: 49.9 umol/L (ref >=30.5)
HDL SIZE: 9 nm — AB (ref >=9.2)
HDL-C: 79 mg/dL (ref >=40)
LARGE VLDL-P: 2.9 nmol/L — AB (ref <=2.7)
LDL (calc): 118 mg/dL — ABNORMAL HIGH (ref <100)
LDL PARTICLE NUMBER: 2007 nmol/L — AB (ref <1000)
LDL SIZE: 20.8 nm (ref >20.5)
LP-IR Score: 44 (ref <=45)
Large HDL-P: 9.5 umol/L (ref >=4.8)
SMALL LDL PARTICLE NUMBER: 1016 nmol/L — AB (ref <=527)
Triglycerides: 207 mg/dL — ABNORMAL HIGH (ref <150)
VLDL SIZE: 45.1 nm (ref <=46.6)

## 2014-01-06 ENCOUNTER — Ambulatory Visit: Payer: Federal, State, Local not specified - PPO | Admitting: Pharmacist

## 2014-01-11 ENCOUNTER — Ambulatory Visit: Payer: Federal, State, Local not specified - PPO | Admitting: Pharmacist

## 2014-01-11 ENCOUNTER — Ambulatory Visit (INDEPENDENT_AMBULATORY_CARE_PROVIDER_SITE_OTHER): Payer: Federal, State, Local not specified - PPO | Admitting: Pharmacist

## 2014-01-11 VITALS — Wt 165.0 lb

## 2014-01-11 DIAGNOSIS — E7800 Pure hypercholesterolemia, unspecified: Secondary | ICD-10-CM

## 2014-01-11 DIAGNOSIS — Z79899 Other long term (current) drug therapy: Secondary | ICD-10-CM

## 2014-01-11 NOTE — Patient Instructions (Signed)
1.  Continue Crestor 10 mg daily. 2.  Continue current low fat diet as this is doing great! 3.  Add 1/2 cup of unsalted almonds to diet as a snack daily.  This will further lower bad cholesterol and good energy source. 4.  Continue exercise regimen. 5.  Recheck NMR LipoProfile and hepatic panel in 4 months (05/20/14 - fasting lab  )  See Kathy [name] a few days later (05/24/14 at 9:30 am)

## 2014-01-11 NOTE — Assessment & Plan Note (Signed)
Patient's weight has dropped 10 lbs over past 6 months, however cholesterol has not improved.  Despite improved diet, exercise, glucose improving, TSH normal, her LDL-P number has trended up slightly. Given she is primary prevention and both weight and glucose improving, will not make any changes in lipid lowering therapy.  She is hesitant to increase Crestor anyway given her h/o myalgias on other statins.  Will have patient start eating 1/2 cup (60 grams) of unsalted almonds daily which is a great energy efficient source of protein which has also been shown to reduce LDL and non-HDL in addition to statin.  Recheck blood work in 4 months (05/20/14), and see me 05/24/14 to review.

## 2014-01-11 NOTE — Progress Notes (Signed)
Patient here for followup in lipid clinic, and doing well without complaint. She retired in 10/2012 and now has more free time. Her weight and mild insulin resistance been her major issue but she is eating better and exercing more, and now lost 20 lbs over past 1 year, and 10 lbs over past six months.  She walks on the track with her husband 7 days/week (45 minutes daily), and has cut down on fat, sodium, and sugar in her diet.  She is eating 1200 calories per day, < 200 mg of cholestoler daily, and < 5% of calories from saturate fat.  Her stress level is way down, and she feels great. Tolerating her meds well. She changed from [place] Naturals fish oil to Lovaza last year for cost reasons - Lovaza much cheaper for her.  Cardiac risk factors age, elevated LDL-P.  Target LDL < 130 preferred; LDL-P goal < 1300.  Target non-HDL < 160.  Medications currently taking: Crestor 10 mg qd, Lovaza 4 g/d,  metamucil wafers 1-2 per day (takes these only periodically).  Intolerance Lipitor, Zocor (myalgias), Zetia (GI upset), Tricor (LFT elevations). Patient states that she can tolerate fish oil 4 g/d, but 6 g/d causes too much dyspepsia .   Diet-  Much improved - consists of fruit and yogurt for breakfast, salads for lunch, and vegetables and fish/chicken for dinner.  Lunch and dinners have been eaten at home, and healthy meals cooked.  Eats 1200 calories daily, and < 30% from fat.  She avoids processed food entirely.  She has quit drinking wine - down from 7 nights per week.  She drinks 1.5 oz of Vodka at night.  Exercise -  7 days per week she walks 40-45 minutes on a track with her husband.  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  Drinks 1.5 onces of vodka in diet tonic water nightly.  Non-smoker. Family history:  Non-contributory for heart disease.  Labs: 12/2013:  LDL-P number 2007, LDL 118, HDL 79, TG 207, TC 238, small LDL-P 1016 (Crestor 10 mg qd, Lovaza 4 g/d, weight 165 lbs) 06/2013:  LDL-P  number 1756, LDL 95, HDL 59, TG 151, TC 184, small LDL-P 1068 (Crestor 10 mg qd, Nordic Naturals 4 g/d, weight 175 lbs)  Current Outpatient Prescriptions  Medication Sig Dispense Refill  . Coenzyme Q10 (CO Q 10) 100 MG CAPS Take 200 mg by mouth daily.      [name] omega-3 acid ethyl esters (LOVAZA) 1 G capsule Take 2 g by mouth 2 (two) times daily.      . Psyllium (METAMUCIL) WAFR Take 1 Wafer by mouth 2 (two) times daily.      [name] aspirin EC 81 MG tablet Take 81 mg by mouth daily.      [name] levothyroxine (SYNTHROID, LEVOTHROID) 88 MCG tablet Take 88 mcg by mouth daily.      . Multiple Vitamins-Minerals (MULTIVITAMINS THER. W/MINERALS) TABS Take 1 tablet by mouth daily.      . rosuvastatin (CRESTOR) 10 MG tablet Take 10 mg by mouth daily.      [name] VITAMIN D, CHOLECALCIFEROL, PO Take 1 tablet by mouth daily.       No current facility-administered medications for this visit.   Allergies  Allergen Reactions  . Lipitor [Atorvastatin]     Severe muscle aches  . Zetia [Ezetimibe]     GI upset  . Zocor [Simvastatin]     Severe muscle aches

## 2014-05-20 ENCOUNTER — Other Ambulatory Visit: Payer: Federal, State, Local not specified - PPO

## 2014-05-24 ENCOUNTER — Other Ambulatory Visit: Payer: Self-pay

## 2014-05-24 ENCOUNTER — Ambulatory Visit: Payer: Federal, State, Local not specified - PPO | Admitting: Pharmacist

## 2014-05-24 DIAGNOSIS — Z1231 Encounter for screening mammogram for malignant neoplasm of breast: Secondary | ICD-10-CM

## 2014-06-23 ENCOUNTER — Ambulatory Visit
Admission: RE | Admit: 2014-06-23 | Discharge: 2014-06-23 | Disposition: A | Discharge status: Home / Self Care | Payer: Federal, State, Local not specified - PPO | Source: Ambulatory Visit

## 2014-06-23 DIAGNOSIS — Z1231 Encounter for screening mammogram for malignant neoplasm of breast: Secondary | ICD-10-CM

## 2014-06-24 ENCOUNTER — Encounter: Payer: Self-pay | Admitting: Women's Health

## 2014-07-04 ENCOUNTER — Telehealth: Payer: Self-pay | Admitting: Cardiology

## 2014-07-04 ENCOUNTER — Telehealth: Payer: Self-pay

## 2014-07-04 NOTE — Telephone Encounter (Signed)
Yes, ok to refill Lovaza.

## 2014-07-04 NOTE — Telephone Encounter (Signed)
We have managed pts lipids since the beginning of 2010 and last OV was 12/09. Does pt need OV or can we just continue to monitor lipids? Ysidro Evert sees pt again in lipid clinic on 07/20/14.

## 2014-07-05 ENCOUNTER — Other Ambulatory Visit: Payer: Self-pay

## 2014-07-05 MED ORDER — OMEGA-3-ACID ETHYL ESTERS 1 G PO CAPS: 2.0000 g | ORAL_CAPSULE | Freq: Two times a day (BID) | ORAL | Status: DC

## 2014-07-05 NOTE — Telephone Encounter (Signed)
lmtrc

## 2014-07-05 NOTE — Telephone Encounter (Signed)
Pt notified. Appt made for 08/22/14.

## 2014-07-05 NOTE — Telephone Encounter (Signed)
I should see her again once.

## 2014-07-13 ENCOUNTER — Encounter: Payer: Self-pay | Admitting: Women's Health

## 2014-07-13 ENCOUNTER — Ambulatory Visit (INDEPENDENT_AMBULATORY_CARE_PROVIDER_SITE_OTHER): Payer: Federal, State, Local not specified - PPO | Admitting: Women's Health

## 2014-07-13 VITALS — BP 116/70 | Ht 65.0 in | Wt 164.0 lb

## 2014-07-13 DIAGNOSIS — Z01419 Encounter for gynecological examination (general) (routine) without abnormal findings: Secondary | ICD-10-CM

## 2014-07-13 NOTE — Progress Notes (Signed)
Kathy [name] July 20, 1951 [id number]    History:    Presents for annual exam.  Postmenopausal/no HRT/no bleeding. Normal Pap and mammogram history. Normal colonoscopy 2013. Dexas at primary care. Hypercholesteremia/hypothyroid/kidney disease-managed by primary care. Has not had a zostavac or Pneumovax. Has lost 20 pounds with diet and exercise.  Past medical history, past surgical history, family history and social history were all reviewed and documented in the EPIC chart. Retired from [employer].   ROS:  A  12 point ROS was performed and pertinent positives and negatives are included.  Exam:  Filed Vitals:   07/13/14 0858  BP: 116/70    General appearance:  Normal Thyroid:  Symmetrical, normal in size, without palpable masses or nodularity. Respiratory  Auscultation:  Clear without wheezing or rhonchi Cardiovascular  Auscultation:  Regular rate, without rubs, murmurs or gallops  Edema/varicosities:  Not grossly evident Abdominal  Soft,nontender, without masses, guarding or rebound.  Liver/spleen:  No organomegaly noted  Hernia:  None appreciated  Skin  Inspection:  Grossly normal   Breasts: Examined lying and sitting.     Right: Without masses, retractions, discharge or axillary adenopathy.     Left: Without masses, retractions, discharge or axillary adenopathy. Gentitourinary   Inguinal/mons:  Normal without inguinal adenopathy  External genitalia:  Normal  BUS/Urethra/Skene's glands:  Normal  Vagina:  Atrophic  Cervix:  Normal  Uterus:   normal in size, shape and contour.  Midline and mobile  Adnexa/parametria:     Rt: Without masses or tenderness.   Lt: Without masses or tenderness.  Anus and perineum: Normal  Digital rectal exam: Normal sphincter tone without palpated masses or tenderness  Assessment/Plan:  62 y.o.MWF G2P2   for annual exam with no complaints.  Hypercholesteremia/hypothyroid/kidney disease - primary care manages labs and  meds Postmenopausal/no HRT/no bleeding Atrophic vaginitis  Plan: Instructed to followup for zostavac and Pneumovax at primary care. SBE's, continue 3D annual mammogram, history of dense breasts. Continue regular exercise and healthy diet for continued weight loss, vitamin D 2000 daily. Home safety, fall prevention  and importance of exercise reviewed. Pap normal with negative high-risk HPV 2014, new screening guidelines reviewed. Vaginal lubricants with intercourse encouraged.  Note: This dictation was prepared with Dragon/digital dictation.  Any transcriptional errors that result are unintentional. [name] [facility], 10:13 AM 07/13/2014

## 2014-07-13 NOTE — Patient Instructions (Signed)
Health Recommendations for Postmenopausal Women Respected and ongoing research has looked at the most common causes of death, disability, and poor quality of life in postmenopausal women. The causes include heart disease, diseases of blood vessels, diabetes, depression, cancer, and bone loss (osteoporosis). Many things can be done to help lower the chances of developing these and other common problems. CARDIOVASCULAR DISEASE Heart Disease: A heart attack is a medical emergency. Know the signs and symptoms of a heart attack. Below are things women can do to reduce their risk for heart disease.   Do not smoke. If you smoke, quit.  Aim for a healthy weight. Being overweight causes many preventable deaths. Eat a healthy and balanced diet and drink an adequate amount of liquids.  Get moving. Make a commitment to be more physically active. Aim for 30 minutes of activity on most, if not all days of the week.  Eat for heart health. Choose a diet that is low in saturated fat and cholesterol and eliminate trans fat. Include whole grains, vegetables, and fruits. Read and understand the labels on food containers before buying.  Know your numbers. Ask your caregiver to check your blood pressure, cholesterol (total, HDL, LDL, triglycerides) and blood glucose. Work with your caregiver on improving your entire clinical picture.  High blood pressure. Limit or stop your table salt intake (try salt substitute and food seasonings). Avoid salty foods and drinks. Read labels on food containers before buying. Eating well and exercising can help control high blood pressure. STROKE  Stroke is a medical emergency. Stroke may be the result of a blood clot in a blood vessel in the brain or by a brain hemorrhage (bleeding). Know the signs and symptoms of a stroke. To lower the risk of developing a stroke:  Avoid fatty foods.  Quit smoking.  Control your diabetes, blood pressure, and irregular heart rate. THROMBOPHLEBITIS  (BLOOD CLOT) OF THE LEG  Becoming overweight and leading a stationary lifestyle may also contribute to developing blood clots. Controlling your diet and exercising will help lower the risk of developing blood clots. CANCER SCREENING  Breast Cancer: Take steps to reduce your risk of breast cancer.  You should practice "breast self-awareness." This means understanding the normal appearance and feel of your breasts and should include breast self-examination. Any changes detected, no matter how small, should be reported to your caregiver.  After age 40, you should have a clinical breast exam (CBE) every year.  Starting at age 40, you should consider having a mammogram (breast X-ray) every year.  If you have a family history of breast cancer, talk to your caregiver about genetic screening.  If you are at high risk for breast cancer, talk to your caregiver about having an MRI and a mammogram every year.  Intestinal or Stomach Cancer: Tests to consider are a rectal exam, fecal occult blood, sigmoidoscopy, and colonoscopy. Women who are high risk may need to be screened at an earlier age and more often.  Cervical Cancer:  Beginning at age 30, you should have a Pap test every 3 years as long as the past 3 Pap tests have been normal.  If you have had past treatment for cervical cancer or a condition that could lead to cancer, you need Pap tests and screening for cancer for at least 20 years after your treatment.  If you had a hysterectomy for a problem that was not cancer or a condition that could lead to cancer, then you no longer need Pap tests.    If you are between ages 65 and 70, and you have had normal Pap tests going back 10 years, you no longer need Pap tests.  If Pap tests have been discontinued, risk factors (such as a new sexual partner) need to be reassessed to determine if screening should be resumed.  Some medical problems can increase the chance of getting cervical cancer. In these  cases, your caregiver may recommend more frequent screening and Pap tests.  Uterine Cancer: If you have vaginal bleeding after reaching menopause, you should notify your caregiver.  Ovarian Cancer: Other than yearly pelvic exams, there are no reliable tests available to screen for ovarian cancer at this time except for yearly pelvic exams.  Lung Cancer: Yearly chest X-rays can detect lung cancer and should be done on high risk women, such as cigarette smokers and women with chronic lung disease (emphysema).  Skin Cancer: A complete body skin exam should be done at your yearly examination. Avoid overexposure to the sun and ultraviolet light lamps. Use a strong sun block cream when in the sun. All of these things are important for lowering the risk of skin cancer. MENOPAUSE Menopause Symptoms: Hormone therapy products are effective for treating symptoms associated with menopause:  Moderate to severe hot flashes.  Night sweats.  Mood swings.  Headaches.  Tiredness.  Loss of sex drive.  Insomnia.  Other symptoms. Hormone replacement carries certain risks, especially in older women. Women who use or are thinking about using estrogen or estrogen with progestin treatments should discuss that with their caregiver. Your caregiver will help you understand the benefits and risks. The ideal dose of hormone replacement therapy is not known. The Food and Drug Administration (FDA) has concluded that hormone therapy should be used only at the lowest doses and for the shortest amount of time to reach treatment goals.  OSTEOPOROSIS Protecting Against Bone Loss and Preventing Fracture If you use hormone therapy for prevention of bone loss (osteoporosis), the risks for bone loss must outweigh the risk of the therapy. Ask your caregiver about other medications known to be safe and effective for preventing bone loss and fractures. To guard against bone loss or fractures, the following is recommended:  If  you are younger than age 50, take 1000 mg of calcium and at least 600 mg of Vitamin D per day.  If you are older than age 50 but younger than age 70, take 1200 mg of calcium and at least 600 mg of Vitamin D per day.  If you are older than age 70, take 1200 mg of calcium and at least 800 mg of Vitamin D per day. Smoking and excessive alcohol intake increases the risk of osteoporosis. Eat foods rich in calcium and vitamin D and do weight bearing exercises several times a week as your caregiver suggests. DIABETES Diabetes Mellitus: If you have type I or type 2 diabetes, you should keep your blood sugar under control with diet, exercise, and recommended medication. Avoid starchy and fatty foods, and too many sweets. Being overweight can make diabetes control more difficult. COGNITION AND MEMORY Cognition and Memory: Menopausal hormone therapy is not recommended for the prevention of cognitive disorders such as Alzheimer's disease or memory loss.  DEPRESSION  Depression may occur at any age, but it is common in elderly women. This may be because of physical, medical, social (loneliness), or financial problems and needs. If you are experiencing depression because of medical problems and control of symptoms, talk to your caregiver about this. Physical   activity and exercise may help with mood and sleep. Community and volunteer involvement may improve your sense of value and worth. If you have depression and you feel that the problem is getting worse or becoming severe, talk to your caregiver about which treatment options are best for you. ACCIDENTS  Accidents are common and can be serious in elderly woman. Prepare your house to prevent accidents. Eliminate throw rugs, place hand bars in bath, shower, and toilet areas. Avoid wearing high heeled shoes or walking on wet, snowy, and icy areas. Limit or stop driving if you have vision or hearing problems, or if you feel you are unsteady with your movements and  reflexes. HEPATITIS C Hepatitis C is a type of viral infection affecting the liver. It is spread mainly through contact with blood from an infected person. It can be treated, but if left untreated, it can lead to severe liver damage over the years. Many people who are infected do not know that the virus is in their blood. If you are a "baby-boomer", it is recommended that you have one screening test for Hepatitis C. IMMUNIZATIONS  Several immunizations are important to consider having during your senior years, including:   Tetanus, diphtheria, and pertussis booster shot.  Influenza every year before the flu season begins.  Pneumonia vaccine.  Shingles vaccine.  Others, as indicated based on your specific needs. Talk to your caregiver about these. Document Released: 12/20/2005 Document Revised: 03/14/2014 Document Reviewed: 08/15/2008 [employer] Patient Information 2015 [employer]. This information is not intended to replace advice given to you by your health care provider. Make sure you discuss any questions you have with your health care provider.  

## 2014-07-14 ENCOUNTER — Other Ambulatory Visit (INDEPENDENT_AMBULATORY_CARE_PROVIDER_SITE_OTHER): Payer: Federal, State, Local not specified - PPO

## 2014-07-14 DIAGNOSIS — Z79899 Other long term (current) drug therapy: Secondary | ICD-10-CM

## 2014-07-14 DIAGNOSIS — E7800 Pure hypercholesterolemia, unspecified: Secondary | ICD-10-CM

## 2014-07-14 LAB — HEPATIC FUNCTION PANEL
ALT: 28 U/L (ref 0–35)
AST: 37 U/L (ref 0–37)
Albumin: 3.8 g/dL (ref 3.5–5.2)
Alkaline Phosphatase: 59 U/L (ref 39–117)
Bilirubin, Direct: 0.1 mg/dL (ref 0.0–0.3)
TOTAL PROTEIN: 6.8 g/dL (ref 6.0–8.3)
Total Bilirubin: 0.8 mg/dL (ref 0.2–1.2)

## 2014-07-15 ENCOUNTER — Encounter: Payer: Self-pay | Admitting: Women's Health

## 2014-07-16 LAB — NMR LIPOPROFILE WITH LIPIDS
Cholesterol, Total: 200 mg/dL — ABNORMAL HIGH (ref 100–199)
HDL Particle Number: 38.7 umol/L (ref >=30.5)
HDL Size: 9.3 nm (ref >=9.2)
HDL-C: 71 mg/dL (ref >39)
LARGE VLDL-P: 1 nmol/L (ref <=2.7)
LDL CALC: 109 mg/dL — AB (ref 0–99)
LDL PARTICLE NUMBER: 1289 nmol/L — AB (ref <1000)
LDL SIZE: 20.9 nm (ref >=20.8)
Large HDL-P: 7.6 umol/L (ref >=4.8)
SMALL LDL PARTICLE NUMBER: 636 nmol/L — AB (ref <=527)
Triglycerides: 99 mg/dL (ref 0–149)
VLDL SIZE: 39.7 nm (ref <=46.6)

## 2014-07-20 ENCOUNTER — Ambulatory Visit (INDEPENDENT_AMBULATORY_CARE_PROVIDER_SITE_OTHER): Payer: Federal, State, Local not specified - PPO | Admitting: Pharmacist

## 2014-07-20 DIAGNOSIS — Z79899 Other long term (current) drug therapy: Secondary | ICD-10-CM

## 2014-07-20 DIAGNOSIS — E7800 Pure hypercholesterolemia, unspecified: Secondary | ICD-10-CM

## 2014-07-20 NOTE — Patient Instructions (Signed)
1.  Continue Crestor 10 mg daily, continue Lovaza 4 per day, continue metamucil wafers daily, continue 1/4 cup almonds daily. 2. Continue 1200 calories per day and walking 7 days per week. 3.  Recheck cholesterol and liver in 6 months on 01/25/15 and we will call with results.

## 2014-07-20 NOTE — Progress Notes (Signed)
Patient here for followup in lipid clinic, and doing well without complaint. She retired in 10/2012 and now has more free time. Her weight and mild insulin resistance been her major issue but she is eating better and exercing more, and now lost 20 lbs over past 18 months.  She walks on the track with her husband 7 days/week (45 minutes daily), and has cut down on fat, sodium, and sugar in her diet.  She is eating 1200 calories per day, < 200 mg of cholestoler daily, and < 5% of calories from saturate fat.  Her stress level is way down, and she feels great. Tolerating her meds well. She changed from [place] [employer]atur[employer] fish oil to Lovaza last year for cost reasons - Lovaza much cheaper for her.  She is now eating 1/4 cup of unsalted almonds daily as well.  LDL-P number and TG have improved significantly over past 6 months, and NMR panel now at goal.  Cardiac risk factors age, elevated LDL-P.  Target LDL < 130 preferred; LDL-P goal < 1300.  Target non-HDL < 160.  Medications currently taking: Crestor 10 mg qd, Lovaza 4 g/d,  metamucil wafers 1-2 per day (takes these only periodically) Intolerance Lipitor, Zocor (myalgias), Zetia (GI upset), Tricor (LFT elevations). Patient states that she can tolerate fish oil 4 g/d, but 6 g/d causes too much dyspepsia .   Diet-  Much improved - consists of fruit and yogurt for breakfast, salads for lunch, and vegetables and fish/chicken for dinner.  Lunch and dinners have been eaten at home, and healthy meals cooked.  Eats 1200 calories daily, and < 30% from fat.  She avoids processed food entirely. Eats 1/4 cup of unsalted almonds daily.  She has quit drinking wine - down from 7 nights per week.  She drinks 1.5 oz of Vodka at night.  Exercise -  7 days per week she walks 40-45 minutes on a track with her husband.  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  Drinks 1.5 onces of vodka in diet tonic water nightly.  Non-smoker. Family history:  Non-contributory for  heart disease.  Labs: 07/2014:  LDL-P number 1289, LDL 109, HDL 71, TG 99, TC 200, small LDL-P number 636 (Crestor 10 mg qd, Lovaza 4 g/d, weight 165 lbs) 12/2013:  LDL-P number 2007, LDL 118, HDL 79, TG 207, TC 238, small LDL-P 1016 (Crestor 10 mg qd, Lovaza 4 g/d, weight 165 lbs) 06/2013:  LDL-P number 1756, LDL 95, HDL 59, TG 151, TC 184, small LDL-P 1068 (Crestor 10 mg qd, [employer] Naturals 4 g/d, weight 175 lbs)  Current Outpatient Prescriptions  Medication Sig Dispense Refill  . aspirin EC 81 MG tablet Take 81 mg by mouth daily.      . Biotin 1000 MCG tablet Take 1,000 mcg by mouth 3 (three) times daily.      . Coenzyme Q10 (CO Q 10) 100 MG CAPS Take 200 mg by mouth daily.      . Dermatological Products, Misc. (NUVAIL EX) Apply topically.      [name] levothyroxine (SYNTHROID, LEVOTHROID) 88 MCG tablet Take 88 mcg by mouth daily.      . Multiple Vitamins-Minerals (MULTIVITAMINS THER. W/MINERALS) TABS Take 1 tablet by mouth daily.      [name] omega-3 acid ethyl esters (LOVAZA) 1 G capsule Take 2 capsules (2 g total) by mouth 2 (two) times daily.  120 capsule  4  . Psyllium (METAMUCIL) WAFR Take 1 Wafer by mouth 2 (two)  times daily.      . rosuvastatin (CRESTOR) 10 MG tablet Take 10 mg by mouth daily.      [name] tretinoin (RETIN-A) 0.025 % cream Apply topically at bedtime.      [name] VITAMIN D, CHOLECALCIFEROL, PO Take 1 tablet by mouth daily.       No current facility-administered medications for this visit.   Allergies  Allergen Reactions  . Lipitor [Atorvastatin]     Severe muscle aches  . Zetia [Ezetimibe]     GI upset  . Zocor [Simvastatin]     Severe muscle aches

## 2014-07-20 NOTE — Assessment & Plan Note (Signed)
Excellent improvement in LDL-P number and TG and now both of these at desired target.  LFTs normal as well.  Will continue current regimen, and continue same weight loss efforts/diet/exercise.  She will recheck NMR / LFTs in 6 months (lab only), and we can call with results.  Won't need to be seen in lipid clinic again unless she starts having problems with medication or unless her cholesterol goes up significantly.  Patient agrees to call if problems.  Will recheck labs 01/25/15.

## 2014-07-21 ENCOUNTER — Ambulatory Visit: Payer: Federal, State, Local not specified - PPO | Admitting: Pharmacist

## 2014-08-22 ENCOUNTER — Encounter: Payer: Self-pay | Admitting: Interventional Cardiology

## 2014-08-22 ENCOUNTER — Ambulatory Visit (INDEPENDENT_AMBULATORY_CARE_PROVIDER_SITE_OTHER): Payer: Federal, State, Local not specified - PPO | Admitting: Interventional Cardiology

## 2014-08-22 VITALS — BP 120/68 | HR 64 | Ht 65.0 in | Wt 165.1 lb

## 2014-08-22 DIAGNOSIS — E7800 Pure hypercholesterolemia, unspecified: Secondary | ICD-10-CM

## 2014-08-22 DIAGNOSIS — Z0389 Encounter for observation for other suspected diseases and conditions ruled out: Secondary | ICD-10-CM

## 2014-08-22 NOTE — Patient Instructions (Signed)
Your physician recommends that you continue on your current medications as directed. Please refer to the Current Medication list given to you today.  Your physician wants you to follow-up in: 1 year with Dr. Varanasi. You will receive a reminder letter in the mail two months in advance. If you don't receive a letter, please call our office to schedule the follow-up appointment.  

## 2014-08-22 NOTE — Progress Notes (Signed)
Patient ID: Kathy [name], female   DOB: 02/21/1951, 63 y.o.   MRN: [id number]    [address], [place], [place]  [address] Phone: [phone] Fax:  [phone]  Date:  08/22/2014   ID:  Kathy [name], DOB January 22, 1951, MRN [id number]  PCP:  Shirline Frees, MD      History of Present Illness: Kathy [name] is a 63 y.o. female with a history of high cholesterol who has been intolerant of Zetia, Lipitor, Zocor. Crestor alone was tolerated but in combination with Zetia, she had severe stomach upset, vomiting. She has been managed in the lipid clinic. She has been on Lovaza and Crestor.  Denies : Chest pain.  Dyspnea on exertion.  Orthopnea.  Shortness of breath.  Leg edema.  Dizziness.   She eats a healthy diet and walks regularly.  Wt Readings from Last 3 Encounters:  08/22/14 165 lb 1.9 oz (74.898 kg)  07/13/14 164 lb (74.39 kg)  01/11/14 165 lb (74.844 kg)     Past Medical History  Diagnosis Date  . Hyperlipidemia   . Thyroid disease   . Osteopenia 2007    -18  . Hypercholesterolemia   . Rosacea   . CKD (chronic kidney disease), stage III     Current Outpatient Prescriptions  Medication Sig Dispense Refill  . aspirin EC 81 MG tablet Take 81 mg by mouth daily.      . Biotin 1000 MCG tablet Take 1,000 mcg by mouth 2 (two) times daily.       . Coenzyme Q10 (CO Q 10) 100 MG CAPS Take 200 mg by mouth daily.      . Dermatological Products, Misc. (NUVAIL EX) Apply topically.      [name] levothyroxine (SYNTHROID, LEVOTHROID) 88 MCG tablet Take 88 mcg by mouth daily.      . Multiple Vitamins-Minerals (MULTIVITAMINS THER. W/MINERALS) TABS Take 1 tablet by mouth daily.      [name] omega-3 acid ethyl esters (LOVAZA) 1 G capsule Take 2 capsules (2 g total) by mouth 2 (two) times daily.  120 capsule  4  . Psyllium (METAMUCIL) WAFR Take 1 Wafer by mouth as needed.       . rosuvastatin (CRESTOR) 10 MG tablet Take 10 mg by mouth daily.      [name] tretinoin (RETIN-A) 0.025 %  cream Apply topically at bedtime.      [name] VITAMIN D, CHOLECALCIFEROL, PO Take 1 tablet by mouth daily.       No current facility-administered medications for this visit.    Allergies:    Allergies  Allergen Reactions  . Lipitor [Atorvastatin]     Severe muscle aches  . Zetia [Ezetimibe]     GI upset  . Zocor [Simvastatin]     Severe muscle aches    Social History:  The patient  reports that she has never smoked. She has never used smokeless tobacco. She reports that she drinks alcohol. She reports that she does not use illicit drugs.   Family History:  The patient's family history includes AAA (abdominal aortic aneurysm) in her father; Diabetes Mellitus I in her mother; Heart failure in her other; Hypercholesterolemia in her father and mother; Prostate cancer in her father; Stroke in her other.   ROS:  Please see the history of present illness.  No nausea, vomiting.  No fevers, chills.  No focal weakness.  No dysuria.    All other systems reviewed and negative.   PHYSICAL EXAM: VS:  BP 120/68  Pulse 64  Ht 5\' 5"  (1.651 m)  Wt 165 lb 1.9 oz (74.898 kg)  BMI 27.48 kg/m2 Well nourished, well developed, in no acute distress HEENT: normal Neck: no JVD, no carotid bruits Cardiac:  normal S1, S2; RRR;  Lungs:  clear to auscultation bilaterally, no wheezing, rhonchi or rales Abd: soft, nontender, no hepatomegaly Ext: no edema Skin: warm and dry Neuro:   no focal abnormalities noted  EKG:  NSR, low voltage     ASSESSMENT AND PLAN:  1. Hyperlipidemia: Lipids reviewed and well controlled in 9/15.  Continue current meds. Continue healthy diet and regular exercise.  Can try some resistance training to build muscle. Lipids in 6 months.  2. F/u in one year.  Signed, Mina Marble, MD, [facility] 08/22/2014 3:33 PM

## 2014-09-12 ENCOUNTER — Encounter: Payer: Self-pay | Admitting: Interventional Cardiology

## 2015-01-25 ENCOUNTER — Other Ambulatory Visit: Payer: Federal, State, Local not specified - PPO

## 2015-03-08 ENCOUNTER — Other Ambulatory Visit (INDEPENDENT_AMBULATORY_CARE_PROVIDER_SITE_OTHER): Payer: Medicare Other | Admitting: *Deleted

## 2015-03-08 DIAGNOSIS — E7800 Pure hypercholesterolemia, unspecified: Secondary | ICD-10-CM

## 2015-03-08 DIAGNOSIS — Z79899 Other long term (current) drug therapy: Secondary | ICD-10-CM

## 2015-03-08 LAB — HEPATIC FUNCTION PANEL
ALBUMIN: 4.3 g/dL (ref 3.5–5.2)
ALT: 36 U/L — AB (ref 0–35)
AST: 23 U/L (ref 0–37)
Alkaline Phosphatase: 73 U/L (ref 39–117)
BILIRUBIN DIRECT: 0.1 mg/dL (ref 0.0–0.3)
BILIRUBIN TOTAL: 0.5 mg/dL (ref 0.2–1.2)
TOTAL PROTEIN: 6.9 g/dL (ref 6.0–8.3)

## 2015-03-08 NOTE — Addendum Note (Signed)
Addended by: Eulis Foster on: 03/08/2015 07:54 AM   Modules accepted: Orders

## 2015-03-10 LAB — NMR LIPOPROFILE WITH LIPIDS
CHOLESTEROL, TOTAL: 214 mg/dL — AB (ref 100–199)
HDL Particle Number: 44.6 umol/L (ref >=30.5)
HDL SIZE: 9.1 nm — AB (ref >=9.2)
HDL-C: 70 mg/dL (ref >39)
LARGE VLDL-P: 1.9 nmol/L (ref <=2.7)
LDL CALC: 114 mg/dL — AB (ref 0–99)
LDL PARTICLE NUMBER: 1720 nmol/L — AB (ref <1000)
LDL SIZE: 21.3 nm (ref >=20.8)
LP-IR SCORE: 26 (ref <=45)
Large HDL-P: 8.3 umol/L (ref >=4.8)
Small LDL Particle Number: 740 nmol/L — ABNORMAL HIGH (ref <=527)
Triglycerides: 148 mg/dL (ref 0–149)
VLDL SIZE: 40.2 nm (ref <=46.6)

## 2015-03-17 ENCOUNTER — Other Ambulatory Visit: Payer: Self-pay | Admitting: *Deleted

## 2015-03-17 DIAGNOSIS — E785 Hyperlipidemia, unspecified: Secondary | ICD-10-CM

## 2015-04-12 ENCOUNTER — Telehealth: Payer: Self-pay | Admitting: Interventional Cardiology

## 2015-04-12 NOTE — Telephone Encounter (Signed)
New problem   Pt stated someone called her from this office she thinks concerning lab work.

## 2015-04-12 NOTE — Telephone Encounter (Signed)
Spoke with pt and she states that she had a message to call back. Pt states that she had some lab work done in April but was aware of the results and other then that she didn't know why anyone would be calling. Pt states that she has also spoke with Gay Filler in the past. Informed pt that I did not see where anyone had called so I was unsure of who it may be. Pt states that if it wasn't me then she just wont worry about it. Spoke with [facility] nurses and they stated that they had no called pt.

## 2015-04-21 DIAGNOSIS — M7989 Other specified soft tissue disorders: Secondary | ICD-10-CM | POA: Diagnosis not present

## 2015-04-24 ENCOUNTER — Other Ambulatory Visit: Payer: Self-pay | Admitting: Family Medicine

## 2015-04-24 DIAGNOSIS — G5612 Other lesions of median nerve, left upper limb: Secondary | ICD-10-CM

## 2015-04-27 ENCOUNTER — Ambulatory Visit
Admission: RE | Admit: 2015-04-27 | Discharge: 2015-04-27 | Disposition: A | Discharge status: Home / Self Care | Payer: Medicare Other | Source: Ambulatory Visit | Attending: Family Medicine | Admitting: Family Medicine

## 2015-04-27 DIAGNOSIS — G5612 Other lesions of median nerve, left upper limb: Secondary | ICD-10-CM | POA: Diagnosis not present

## 2015-05-01 ENCOUNTER — Other Ambulatory Visit: Payer: Self-pay | Admitting: Interventional Cardiology

## 2015-05-08 ENCOUNTER — Other Ambulatory Visit: Payer: Self-pay

## 2015-06-09 DIAGNOSIS — L814 Other melanin hyperpigmentation: Secondary | ICD-10-CM | POA: Diagnosis not present

## 2015-06-09 DIAGNOSIS — L821 Other seborrheic keratosis: Secondary | ICD-10-CM | POA: Diagnosis not present

## 2015-06-09 DIAGNOSIS — L570 Actinic keratosis: Secondary | ICD-10-CM | POA: Diagnosis not present

## 2015-06-09 DIAGNOSIS — D225 Melanocytic nevi of trunk: Secondary | ICD-10-CM | POA: Diagnosis not present

## 2015-06-09 DIAGNOSIS — Z419 Encounter for procedure for purposes other than remedying health state, unspecified: Secondary | ICD-10-CM | POA: Diagnosis not present

## 2015-06-29 ENCOUNTER — Encounter: Payer: Self-pay | Admitting: Interventional Cardiology

## 2015-07-13 ENCOUNTER — Other Ambulatory Visit: Payer: Self-pay

## 2015-07-13 DIAGNOSIS — Z1231 Encounter for screening mammogram for malignant neoplasm of breast: Secondary | ICD-10-CM

## 2015-09-04 ENCOUNTER — Other Ambulatory Visit: Payer: Self-pay | Admitting: Interventional Cardiology

## 2015-09-04 ENCOUNTER — Other Ambulatory Visit (INDEPENDENT_AMBULATORY_CARE_PROVIDER_SITE_OTHER): Payer: Medicare Other | Admitting: *Deleted

## 2015-09-04 ENCOUNTER — Encounter: Payer: Self-pay | Admitting: Women's Health

## 2015-09-04 ENCOUNTER — Ambulatory Visit (INDEPENDENT_AMBULATORY_CARE_PROVIDER_SITE_OTHER): Payer: Medicare Other | Admitting: Women's Health

## 2015-09-04 VITALS — BP 126/84 | Ht 64.5 in | Wt 170.0 lb

## 2015-09-04 DIAGNOSIS — Z01419 Encounter for gynecological examination (general) (routine) without abnormal findings: Secondary | ICD-10-CM | POA: Diagnosis not present

## 2015-09-04 DIAGNOSIS — N952 Postmenopausal atrophic vaginitis: Secondary | ICD-10-CM | POA: Diagnosis not present

## 2015-09-04 DIAGNOSIS — E559 Vitamin D deficiency, unspecified: Secondary | ICD-10-CM | POA: Diagnosis not present

## 2015-09-04 DIAGNOSIS — E786 Lipoprotein deficiency: Secondary | ICD-10-CM | POA: Diagnosis not present

## 2015-09-04 DIAGNOSIS — E785 Hyperlipidemia, unspecified: Secondary | ICD-10-CM

## 2015-09-04 DIAGNOSIS — E039 Hypothyroidism, unspecified: Secondary | ICD-10-CM | POA: Diagnosis not present

## 2015-09-04 NOTE — Patient Instructions (Signed)
Menopause is a normal process in which your reproductive ability comes to an end. This process happens gradually over a span of months to years, usually between the ages of 48 and 55. Menopause is complete when you have missed 12 consecutive menstrual periods. It is important to talk with your health care provider about some of the most common conditions that affect postmenopausal women, such as heart disease, cancer, and bone loss (osteoporosis). Adopting a healthy lifestyle and getting preventive care can help to promote your health and wellness. Those actions can also lower your chances of developing some of these common conditions. WHAT SHOULD I KNOW ABOUT MENOPAUSE? During menopause, you may experience a number of symptoms, such as:  Moderate-to-severe hot flashes.  Night sweats.  Decrease in sex drive.  Mood swings.  Headaches.  Tiredness.  Irritability.  Memory problems.  Insomnia. Choosing to treat or not to treat menopausal changes is an individual decision that you make with your health care provider. WHAT SHOULD I KNOW ABOUT HORMONE REPLACEMENT THERAPY AND SUPPLEMENTS? Hormone therapy products are effective for treating symptoms that are associated with menopause, such as hot flashes and night sweats. Hormone replacement carries certain risks, especially as you become older. If you are thinking about using estrogen or estrogen with progestin treatments, discuss the benefits and risks with your health care provider. WHAT SHOULD I KNOW ABOUT HEART DISEASE AND STROKE? Heart disease, heart attack, and stroke become more likely as you age. This may be due, in part, to the hormonal changes that your body experiences during menopause. These can affect how your body processes dietary fats, triglycerides, and cholesterol. Heart attack and stroke are both medical emergencies. There are many things that you can do to help prevent heart disease and stroke:  Have your blood pressure  checked at least every 1-2 years. High blood pressure causes heart disease and increases the risk of stroke.  If you are 55-79 years old, ask your health care provider if you should take aspirin to prevent a heart attack or a stroke.  Do not use any tobacco products, including cigarettes, chewing tobacco, or electronic cigarettes. If you need help quitting, ask your health care provider.  It is important to eat a healthy diet and maintain a healthy weight.  Be sure to include plenty of vegetables, fruits, low-fat dairy products, and lean protein.  Avoid eating foods that are high in solid fats, added sugars, or salt (sodium).  Get regular exercise. This is one of the most important things that you can do for your health.  Try to exercise for at least 150 minutes each week. The type of exercise that you do should increase your heart rate and make you sweat. This is known as moderate-intensity exercise.  Try to do strengthening exercises at least twice each week. Do these in addition to the moderate-intensity exercise.  Know your numbers.Ask your health care provider to check your cholesterol and your blood glucose. Continue to have your blood tested as directed by your health care provider. WHAT SHOULD I KNOW ABOUT CANCER SCREENING? There are several types of cancer. Take the following steps to reduce your risk and to catch any cancer development as early as possible. Breast Cancer  Practice breast self-awareness.  This means understanding how your breasts normally appear and feel.  It also means doing regular breast self-exams. Let your health care provider know about any changes, no matter how small.  If you are 40 or older, have a clinician do a   breast exam (clinical breast exam or CBE) every year. Depending on your age, family history, and medical history, it may be recommended that you also have a yearly breast X-ray (mammogram).  If you have a family history of breast cancer,  talk with your health care provider about genetic screening.  If you are at high risk for breast cancer, talk with your health care provider about having an MRI and a mammogram every year.  Breast cancer (BRCA) gene test is recommended for women who have family members with BRCA-related cancers. Results of the assessment will determine the need for genetic counseling and BRCA1 and for BRCA2 testing. BRCA-related cancers include these types:  Breast. This occurs in males or females.  Ovarian.  Tubal. This may also be called fallopian tube cancer.  Cancer of the abdominal or pelvic lining (peritoneal cancer).  Prostate.  Pancreatic. Cervical, Uterine, and Ovarian Cancer Your health care provider may recommend that you be screened regularly for cancer of the pelvic organs. These include your ovaries, uterus, and vagina. This screening involves a pelvic exam, which includes checking for microscopic changes to the surface of your cervix (Pap test).  For women ages 21-65, health care providers may recommend a pelvic exam and a Pap test every three years. For women ages 77-65, they may recommend the Pap test and pelvic exam, combined with testing for human papilloma virus (HPV), every five years. Some types of HPV increase your risk of cervical cancer. Testing for HPV may also be done on women of any age who have unclear Pap test results.  Other health care providers may not recommend any screening for nonpregnant women who are considered low risk for pelvic cancer and have no symptoms. Ask your health care provider if a screening pelvic exam is right for you.  If you have had past treatment for cervical cancer or a condition that could lead to cancer, you need Pap tests and screening for cancer for at least 20 years after your treatment. If Pap tests have been discontinued for you, your risk factors (such as having a new sexual partner) need to be reassessed to determine if you should start having  screenings again. Some women have medical problems that increase the chance of getting cervical cancer. In these cases, your health care provider may recommend that you have screening and Pap tests more often.  If you have a family history of uterine cancer or ovarian cancer, talk with your health care provider about genetic screening.  If you have vaginal bleeding after reaching menopause, tell your health care provider.  There are currently no reliable tests available to screen for ovarian cancer. Lung Cancer Lung cancer screening is recommended for adults 3-70 years old who are at high risk for lung cancer because of a history of smoking. A yearly low-dose CT scan of the lungs is recommended if you:  Currently smoke.  Have a history of at least 30 pack-years of smoking and you currently smoke or have quit within the past 15 years. A pack-year is smoking an average of one pack of cigarettes per day for one year. Yearly screening should:  Continue until it has been 15 years since you quit.  Stop if you develop a health problem that would prevent you from having lung cancer treatment. Colorectal Cancer  This type of cancer can be detected and can often be prevented.  Routine colorectal cancer screening usually begins at age 38 and continues through age 12.  If you have  risk factors for colon cancer, your health care provider may recommend that you be screened at an earlier age.  If you have a family history of colorectal cancer, talk with your health care provider about genetic screening.  Your health care provider may also recommend using home test kits to check for hidden blood in your stool.  A small camera at the end of a tube can be used to examine your colon directly (sigmoidoscopy or colonoscopy). This is done to check for the earliest forms of colorectal cancer.  Direct examination of the colon should be repeated every 5-10 years until age 67. However, if early forms of  precancerous polyps or small growths are found or if you have a family history or genetic risk for colorectal cancer, you may need to be screened more often. Skin Cancer  Check your skin from head to toe regularly.  Monitor any moles. Be sure to tell your health care provider:  About any new moles or changes in moles, especially if there is a change in a mole's shape or color.  If you have a mole that is larger than the size of a pencil eraser.  If any of your family members has a history of skin cancer, especially at a Khamari Yousuf age, talk with your health care provider about genetic screening.  Always use sunscreen. Apply sunscreen liberally and repeatedly throughout the day.  Whenever you are outside, protect yourself by wearing long sleeves, pants, a wide-brimmed hat, and sunglasses. WHAT SHOULD I KNOW ABOUT OSTEOPOROSIS? Osteoporosis is a condition in which bone destruction happens more quickly than new bone creation. After menopause, you may be at an increased risk for osteoporosis. To help prevent osteoporosis or the bone fractures that can happen because of osteoporosis, the following is recommended:  If you are 39-61 years old, get at least 1,000 mg of calcium and at least 600 mg of vitamin D per day.  If you are older than age 16 but younger than age 7, get at least 1,200 mg of calcium and at least 600 mg of vitamin D per day.  If you are older than age 47, get at least 1,200 mg of calcium and at least 800 mg of vitamin D per day. Smoking and excessive alcohol intake increase the risk of osteoporosis. Eat foods that are rich in calcium and vitamin D, and do weight-bearing exercises several times each week as directed by your health care provider. WHAT SHOULD I KNOW ABOUT HOW MENOPAUSE AFFECTS [place]? Depression may occur at any age, but it is more common as you become older. Common symptoms of depression include:  Low or sad mood.  Changes in sleep patterns.  Changes  in appetite or eating patterns.  Feeling an overall lack of motivation or enjoyment of activities that you previously enjoyed.  Frequent crying spells. Talk with your health care provider if you think that you are experiencing depression. WHAT SHOULD I KNOW ABOUT IMMUNIZATIONS? It is important that you get and maintain your immunizations. These include:  Tetanus, diphtheria, and pertussis (Tdap) booster vaccine.  Influenza every year before the flu season begins.  Pneumonia vaccine.  Shingles vaccine. Your health care provider may also recommend other immunizations.   This information is not intended to replace advice given to you by your health care provider. Make sure you discuss any questions you have with your health care provider.   Document Released: 12/20/2005 Document Revised: 11/18/2014 Document Reviewed: 06/30/2014 Elsevier Interactive Patient Education 2016 Elsevier  Inc. Bone Health Bones protect organs, store calcium, and anchor muscles. Good health habits, such as eating nutritious foods and exercising regularly, are important for maintaining healthy bones. They can also help to prevent a condition that causes bones to lose density and become weak and brittle (osteoporosis). WHY IS BONE MASS IMPORTANT? Bone mass refers to the amount of bone tissue that you have. The higher your bone mass, the stronger your bones. An important step toward having healthy bones throughout life is to have strong and dense bones during childhood. A Galaxy Borden adult who has a high bone mass is more likely to have a high bone mass later in life. Bone mass at its greatest it is called peak bone mass. A large decline in bone mass occurs in older adults. In women, it occurs about the time of menopause. During this time, it is important to practice good health habits, because if more bone is lost than what is replaced, the bones will become less healthy and more likely to break (fracture). If you find that  you have a low bone mass, you may be able to prevent osteoporosis or further bone loss by changing your diet and lifestyle. HOW CAN I FIND OUT IF MY BONE MASS IS LOW? Bone mass can be measured with an X-ray test that is called a bone mineral density (BMD) test. This test is recommended for all women who are age 81 or older. It may also be recommended for men who are age 881 or older, or for people who are more likely to develop osteoporosis due to:  Having bones that break easily.  Having a long-term disease that weakens bones, such as kidney disease or rheumatoid arthritis.  Having menopause earlier than normal.  Taking medicine that weakens bones, such as steroids, thyroid hormones, or hormone treatment for breast cancer or prostate cancer.  Smoking.  Drinking three or more alcoholic drinks each day. WHAT ARE THE NUTRITIONAL RECOMMENDATIONS FOR HEALTHY BONES? To have healthy bones, you need to get enough of the right minerals and vitamins. Most nutrition experts recommend getting these nutrients from the foods that you eat. Nutritional recommendations vary from person to person. Ask your health care provider what is healthy for you. Here are some general guidelines. Calcium Recommendations Calcium is the most important (essential) mineral for bone health. Most people can get enough calcium from their diet, but supplements may be recommended for people who are at risk for osteoporosis. Good sources of calcium include:  Dairy products, such as low-fat or nonfat milk, cheese, and yogurt.  Dark green leafy vegetables, such as bok choy and broccoli.  Calcium-fortified foods, such as orange juice, cereal, bread, soy beverages, and tofu products.  Nuts, such as almonds. Follow these recommended amounts for daily calcium intake:  Children, age 35-3: 700 mg.  Children, age 88-8: 1,000 mg.  Children, age 50-13: 1,300 mg.  Teens, age 63-18: 1,300 mg.  Adults, age 53-50: 1,000 mg.  Adults,  age 31-70:  Men: 1,000 mg.  Women: 1,200 mg.  Adults, age 79 or older: 1,200 mg.  Pregnant and breastfeeding females:  Teens: 1,300 mg.  Adults: 1,000 mg. Vitamin D Recommendations Vitamin D is the most essential vitamin for bone health. It helps the body to absorb calcium. Sunlight stimulates the skin to make vitamin D, so be sure to get enough sunlight. If you live in a cold climate or you do not get outside often, your health care provider may recommend that you take vitamin D  supplements. Good sources of vitamin D in your diet include:  Egg yolks.  Saltwater fish.  Milk and cereal fortified with vitamin D. Follow these recommended amounts for daily vitamin D intake:  Children and teens, age 68-18: 600 international units.  Adults, age 88 or younger: 400-800 international units.  Adults, age 64 or older: 800-1,000 international units. Other Nutrients Other nutrients for bone health include:  Phosphorus. This mineral is found in meat, poultry, dairy foods, nuts, and legumes. The recommended daily intake for adult men and adult women is 700 mg.  Magnesium. This mineral is found in seeds, nuts, dark green vegetables, and legumes. The recommended daily intake for adult men is 400-420 mg. For adult women, it is 310-320 mg.  Vitamin K. This vitamin is found in green leafy vegetables. The recommended daily intake is 120 mg for adult men and 90 mg for adult women. WHAT TYPE OF PHYSICAL ACTIVITY IS BEST FOR BUILDING AND MAINTAINING HEALTHY BONES? Weight-bearing and strength-building activities are important for building and maintaining peak bone mass. Weight-bearing activities cause muscles and bones to work against gravity. Strength-building activities increases muscle strength that supports bones. Weight-bearing and muscle-building activities include:  Walking and hiking.  Jogging and running.  Dancing.  Gym exercises.  Lifting weights.  Tennis and racquetball.  Climbing  stairs.  Aerobics. Adults should get at least 30 minutes of moderate physical activity on most days. Children should get at least 60 minutes of moderate physical activity on most days. Ask your health care provide what type of exercise is best for you. WHERE CAN I FIND MORE INFORMATION? For more information, check out the following websites:  [place]: YardHomes.se  [employer] of Health: http://www.niams.AnonymousEar.fr.asp   This information is not intended to replace advice given to you by your health care provider. Make sure you discuss any questions you have with your health care provider.   Document Released: 01/18/2004 Document Revised: 03/14/2015 Document Reviewed: 11/02/2014 Elsevier Interactive Patient Education [employer].

## 2015-09-04 NOTE — Addendum Note (Signed)
Addended by: Eulis Foster on: 09/04/2015 07:54 AM   Modules accepted: Orders

## 2015-09-04 NOTE — Addendum Note (Signed)
Addended by: Eulis Foster on: 09/04/2015 07:55 AM   Modules accepted: Orders

## 2015-09-04 NOTE — Progress Notes (Signed)
Kathy [name] 66-Sep-1952 [id number]    History:    Presents for breast and pelvic exam. Normal Pap and mammogram history. Hypothyroid and hypercholesterolemia managed by primary care. Negative colonoscopy 2013. DEXA at primary care showed mild osteopenia. Currently on vaccines scheduled for Pneumovax this week.  Past medical history, past surgical history, family history and social history were all reviewed and documented in the EPIC chart. Retired Engineer, manufacturing systems. Traveling.  ROS:  A ROS was performed and pertinent positives and negatives are included.  Exam:  Filed Vitals:   66/24/16 1356  BP: 126/84    General appearance:  Normal Thyroid:  Symmetrical, normal in size, without palpable masses or nodularity. Respiratory  Auscultation:  Clear without wheezing or rhonchi Cardiovascular  Auscultation:  Regular rate, without rubs, murmurs or gallops  Edema/varicosities:  Not grossly evident Abdominal  Soft,nontender, without masses, guarding or rebound.  Liver/spleen:  No organomegaly noted  Hernia:  None appreciated  Skin  Inspection:  Grossly normal   Breasts: Examined lying and sitting.     Right: Without masses, retractions, discharge or axillary adenopathy.     Left: Without masses, retractions, discharge or axillary adenopathy. Gentitourinary   Inguinal/mons:  Normal without inguinal adenopathy  External genitalia:  Normal  BUS/Urethra/Skene's glands:  Normal  Vagina:  Atrophic  Cervix:  Normal  Uterus:   normal in size, shape and contour.  Midline and mobile  Adnexa/parametria:     Rt: Without masses or tenderness.   Lt: Without masses or tenderness.  Anus and perineum: Normal  Digital rectal exam: Normal sphincter tone without palpated masses or tenderness  Assessment/Plan:  64 y.o. MWF G2 P2 for breast and pelvic exam with no complaints.  Postmenopausal/no bleeding/no HRT Hypothyroid/hypercholesterolemia-primary care managing labs and meds DEXA mild  osteopenia/stable-per primary care Vaginal atrophy  Plan: SBE's, continue annual 3-D screening mammogram history of dense breasts. Home safety, fall prevention, importance of regular exercising reviewed. Vitamin D 2000 daily, instructed to have vitamin D level checked at primary care. UA, Pap normal with negative HR HPV 2014, new screening guidelines reviewed. Continue vaginal lubricants with intercourse.    Kathy [name] [facility], 2:36 PM 09/04/2015

## 2015-09-06 ENCOUNTER — Ambulatory Visit
Admission: RE | Admit: 2015-09-06 | Discharge: 2015-09-06 | Disposition: A | Discharge status: Home / Self Care | Payer: Medicare Other | Source: Ambulatory Visit

## 2015-09-06 ENCOUNTER — Ambulatory Visit (INDEPENDENT_AMBULATORY_CARE_PROVIDER_SITE_OTHER): Payer: Medicare Other | Admitting: Interventional Cardiology

## 2015-09-06 ENCOUNTER — Encounter: Payer: Self-pay | Admitting: Women's Health

## 2015-09-06 ENCOUNTER — Encounter: Payer: Self-pay | Admitting: Interventional Cardiology

## 2015-09-06 VITALS — BP 128/78 | HR 62 | Ht 65.0 in | Wt 170.4 lb

## 2015-09-06 DIAGNOSIS — E7800 Pure hypercholesterolemia, unspecified: Secondary | ICD-10-CM

## 2015-09-06 DIAGNOSIS — Z1231 Encounter for screening mammogram for malignant neoplasm of breast: Secondary | ICD-10-CM

## 2015-09-06 DIAGNOSIS — R7989 Other specified abnormal findings of blood chemistry: Secondary | ICD-10-CM | POA: Diagnosis not present

## 2015-09-06 DIAGNOSIS — R945 Abnormal results of liver function studies: Secondary | ICD-10-CM

## 2015-09-06 LAB — HEPATIC FUNCTION PANEL

## 2015-09-06 NOTE — Progress Notes (Signed)
Patient ID: Kathy [name], female   DOB: 1951-05-26, 64 y.o.   MRN: [id number]     Cardiology Office Note   Date:  09/06/2015   ID:  LATORIA DRY, DOB [birth year], MRN [id number]  PCP:  Shirline Frees, MD    No chief complaint on file. hyperlipidemia   Wt Readings from Last 3 Encounters:  09/06/15 170 lb 6.4 oz (77.293 kg)  09/04/15 170 lb (77.111 kg)  08/22/14 165 lb 1.9 oz (74.898 kg)       History of Present Illness: Kathy [name] is a 64 y.o. female  with a history of high cholesterol who has been intolerant of Zetia, Lipitor, Zocor. Crestor alone was tolerated but in combination with Zetia, she had severe stomach upset, vomiting. She has been managed in the lipid clinic. She has been on Lovaza and Crestor.  Denies : Chest pain.  Dyspnea on exertion.  Orthopnea.  Shortness of breath.  Leg edema.  Dizziness.   She eats a healthy diet and walks regularly.  She feels that she gets at least 150 minutes of exercise /week.  She has had a recent vacation and did not eat very well while away.     Past Medical History  Diagnosis Date  . Rosacea   . CKD (chronic kidney disease), stage III     Past Surgical History  Procedure Laterality Date  . Thyroid surgery  2008    partial thyroidectomy  . Btl    . Pilonidal cyst / sinus excision       Current Outpatient Prescriptions  Medication Sig Dispense Refill  . ALPRAZolam (XANAX) 0.25 MG tablet Take 0.25 mg by mouth every 4 (four) hours as needed for anxiety.    [name] aspirin EC 81 MG tablet Take 81 mg by mouth daily.    . Biotin 1000 MCG tablet Take 1,000 mcg by mouth 2 (two) times daily.     . Coenzyme Q10 (CO Q 10) 100 MG CAPS Take 200 mg by mouth daily.    . Dermatological Products, Misc. (NUVAIL EX) Apply topically as directed.     . Flaxseed, Linseed, (FLAX SEED OIL PO) Take by mouth as directed.    [name] levothyroxine (SYNTHROID, LEVOTHROID) 88 MCG tablet Take 88 mcg by mouth daily.    . Multiple  Vitamins-Minerals (MULTIVITAMINS THER. W/MINERALS) TABS Take 1 tablet by mouth daily.    [name] omega-3 acid ethyl esters (LOVAZA) 1 G capsule TAKE 2 CAPSULES BY MOUTH 2 TIMES DAILY. 120 capsule 4  . Probiotic Product (PROBIOTIC DAILY PO) Take by mouth as directed.    . Psyllium (METAMUCIL PO) Take by mouth as directed.    . rosuvastatin (CRESTOR) 10 MG tablet Take 10 mg by mouth daily.    [name] tretinoin (RETIN-A) 0.025 % cream Apply topically at bedtime.    [name] VITAMIN D, CHOLECALCIFEROL, PO Take 1 tablet by mouth daily.     No current facility-administered medications for this visit.    Allergies:   Lipitor; Zetia; and Zocor    Social History:  The patient  reports that she has never smoked. She has never used smokeless tobacco. She reports that she drinks alcohol. She reports that she does not use illicit drugs.   Family History:  The patient's family history includes AAA (abdominal aortic aneurysm) in her father; Diabetes Mellitus I in her mother; Heart failure in her other; Hypercholesterolemia in her father and mother; Prostate cancer in her father; Stroke in her other.  ROS:  Please see the history of present illness.   Otherwise, review of systems are positive for slightly elevated LFTs.   All other systems are reviewed and negative.    PHYSICAL EXAM: VS:  BP 128/78 mmHg  Pulse 62  Ht 5\' 5"  (1.651 m)  Wt 170 lb 6.4 oz (77.293 kg)  BMI 28.36 kg/m2 , BMI Body mass index is 28.36 kg/(m^2). GEN: Well nourished, well developed, in no acute distress HEENT: normal Neck: no JVD, carotid bruits, or masses Cardiac: RRR; no murmurs, rubs, or gallops,no edema  Respiratory:  clear to auscultation bilaterally, normal work of breathing GI: soft, nontender, nondistended, + BS MS: no deformity or atrophy Skin: warm and dry, no rash Neuro:  Strength and sensation are intact Psych: euthymic mood, full affect   EKG:   The ekg ordered today demonstrates NSR, NSST   Recent Labs: 03/08/2015: ALT  36*   Lipid Panel    Component Value Date/Time   CHOL 214* 03/08/2015 0755   TRIG 148 03/08/2015 0755   HDL 70 03/08/2015 0755   LDLCALC 114* 03/08/2015 0755     Other studies Reviewed: Additional studies/ records that were reviewed today with results demonstrating: labs from 10/16 pending.  Minimally elevated LFT in 4/16..   ASSESSMENT AND PLAN:  1. Hyperlipidemia: Continue Crestor and Lovaza. Await results from Monday's blood test. We talked about healthy diet and regular exercise to help keep her cholesterol down. 2. Elevated LFTs: Minimally elevated. This will also be reported in a few days. 3. Statin intolerant: Intolerant of many different statins. Has tolerated Crestor.   Current medicines are reviewed at length with the patient today.  The patient concerns regarding her medicines were addressed.  The following changes have been made:  No change  Labs/ tests ordered today include:  No orders of the defined types were placed in this encounter.    Recommend 150 minutes/week of aerobic exercise Low fat, low carb, high fiber diet recommended  Disposition:   FU in one year   Teresita Madura., MD  09/06/2015 10:03 AM    [facility] [place], [place], [place]  [address] Phone: [phone]; Fax: [phone]

## 2015-09-06 NOTE — Patient Instructions (Addendum)
Medication Instructions:  Same-No changes  Labwork: None  Testing/Procedures: None  Follow-Up: Your physician wants you to follow-up in: 1 year. You will receive a reminder letter in the mail two months in advance. If you don't receive a letter, please call our office to schedule the follow-up appointment.'      If you need a refill on your cardiac medications before your next appointment, please call your pharmacy.

## 2015-09-08 DIAGNOSIS — Z23 Encounter for immunization: Secondary | ICD-10-CM | POA: Diagnosis not present

## 2015-09-08 DIAGNOSIS — R739 Hyperglycemia, unspecified: Secondary | ICD-10-CM | POA: Diagnosis not present

## 2015-09-08 DIAGNOSIS — Z0000 Encounter for general adult medical examination without abnormal findings: Secondary | ICD-10-CM | POA: Diagnosis not present

## 2015-09-08 DIAGNOSIS — F411 Generalized anxiety disorder: Secondary | ICD-10-CM | POA: Diagnosis not present

## 2015-09-08 DIAGNOSIS — M8588 Other specified disorders of bone density and structure, other site: Secondary | ICD-10-CM | POA: Diagnosis not present

## 2015-09-08 DIAGNOSIS — E559 Vitamin D deficiency, unspecified: Secondary | ICD-10-CM | POA: Diagnosis not present

## 2015-09-08 DIAGNOSIS — E786 Lipoprotein deficiency: Secondary | ICD-10-CM | POA: Diagnosis not present

## 2015-09-08 DIAGNOSIS — F419 Anxiety disorder, unspecified: Secondary | ICD-10-CM | POA: Diagnosis not present

## 2015-09-08 DIAGNOSIS — J309 Allergic rhinitis, unspecified: Secondary | ICD-10-CM | POA: Diagnosis not present

## 2015-09-08 DIAGNOSIS — E039 Hypothyroidism, unspecified: Secondary | ICD-10-CM | POA: Diagnosis not present

## 2015-09-08 DIAGNOSIS — N183 Chronic kidney disease, stage 3 (moderate): Secondary | ICD-10-CM | POA: Diagnosis not present

## 2015-09-09 LAB — CARDIO IQ(R) ADVANCED LIPID PANEL
APOLIPOPROTEIN (CARDIO IQ ADV LIPID PANEL): 97 mg/dL (ref 49–103)
CHOLESTEROL, TOTAL (CARDIO IQ ADV LIPID PANEL): 206 mg/dL — AB (ref 125–200)
Cholesterol/HDL Ratio: 2.9 calc (ref <=5.0)
HDL CHOLESTEROL (CARDIO IQ ADV LIPID PANEL): 72 mg/dL (ref >=46)
LDL CHOLESTEROL CALCULATED (CARDIO IQ ADV LIPID PANEL): 109 mg/dL
LDL Large: 7685 nmol/L (ref 5038–17886)
LDL MEDIUM: 278 nmol/L (ref 121–397)
LDL PARTICLE NUMBER: 1311 nmol/L (ref 1016–2185)
LDL PEAK SIZE: 221.1 Angstrom (ref >=218.2)
LDL Small: 206 nmol/L (ref 115–386)
Lipoprotein (a): 236 nmol/L — ABNORMAL HIGH (ref <75)
NON-HDL CHOLESTEROL (CARDIO IQ ADV LIPID PANEL): 134 mg/dL
TRIGLYCERIDES (CARDIO IQ ADV LIPID PANEL): 127 mg/dL

## 2015-09-13 ENCOUNTER — Telehealth: Payer: Self-pay | Admitting: Interventional Cardiology

## 2015-09-13 DIAGNOSIS — E7800 Pure hypercholesterolemia, unspecified: Secondary | ICD-10-CM

## 2015-09-13 NOTE — Telephone Encounter (Signed)
Follow up   Pt called back, she states the call ended and she is waiting to hear back form you about an appt  She said we have been playing phone tag and to go ahead and schedule it and I will check on Mychart account.

## 2015-09-13 NOTE — Telephone Encounter (Signed)
The pt is advised of her Cardio IQ lab results. See lab results from 09/04/15.

## 2015-09-13 NOTE — Telephone Encounter (Signed)
New Message   Pt is calling to speak to rn she states she is returning your call

## 2015-10-12 DIAGNOSIS — M8589 Other specified disorders of bone density and structure, multiple sites: Secondary | ICD-10-CM | POA: Diagnosis not present

## 2015-10-12 DIAGNOSIS — M859 Disorder of bone density and structure, unspecified: Secondary | ICD-10-CM | POA: Diagnosis not present

## 2015-10-17 ENCOUNTER — Other Ambulatory Visit: Payer: Self-pay | Admitting: Interventional Cardiology

## 2015-10-18 NOTE — Telephone Encounter (Signed)
**Note De-Identified [facility] Obfuscation** Ok to give refill. Thanks.

## 2015-11-14 ENCOUNTER — Encounter: Payer: Self-pay | Admitting: Interventional Cardiology

## 2015-11-14 ENCOUNTER — Other Ambulatory Visit: Payer: Self-pay

## 2015-11-14 MED ORDER — ROSUVASTATIN CALCIUM 10 MG PO TABS: 10.0000 mg | ORAL_TABLET | Freq: Every day | ORAL | Status: DC

## 2016-03-12 ENCOUNTER — Other Ambulatory Visit: Payer: Medicare Other

## 2016-08-13 ENCOUNTER — Other Ambulatory Visit: Payer: Self-pay | Admitting: Women's Health

## 2016-08-13 DIAGNOSIS — Z1231 Encounter for screening mammogram for malignant neoplasm of breast: Secondary | ICD-10-CM

## 2016-08-18 DIAGNOSIS — J040 Acute laryngitis: Secondary | ICD-10-CM | POA: Diagnosis not present

## 2016-08-18 DIAGNOSIS — J019 Acute sinusitis, unspecified: Secondary | ICD-10-CM | POA: Diagnosis not present

## 2016-09-03 ENCOUNTER — Other Ambulatory Visit: Payer: Medicare Other | Admitting: *Deleted

## 2016-09-03 ENCOUNTER — Encounter: Payer: Self-pay | Admitting: Interventional Cardiology

## 2016-09-03 DIAGNOSIS — R7301 Impaired fasting glucose: Secondary | ICD-10-CM | POA: Diagnosis not present

## 2016-09-03 DIAGNOSIS — E7800 Pure hypercholesterolemia, unspecified: Secondary | ICD-10-CM | POA: Diagnosis not present

## 2016-09-03 DIAGNOSIS — N183 Chronic kidney disease, stage 3 (moderate): Secondary | ICD-10-CM | POA: Diagnosis not present

## 2016-09-03 LAB — HEPATIC FUNCTION PANEL
ALBUMIN: 4.2 g/dL (ref 3.6–5.1)
ALT: 20 U/L (ref 6–29)
AST: 33 U/L (ref 10–35)
Alkaline Phosphatase: 63 U/L (ref 33–130)
BILIRUBIN TOTAL: 0.8 mg/dL (ref 0.2–1.2)
Bilirubin, Direct: 0.1 mg/dL (ref <=0.2)
Indirect Bilirubin: 0.7 mg/dL (ref 0.2–1.2)
TOTAL PROTEIN: 6.4 g/dL (ref 6.1–8.1)

## 2016-09-04 ENCOUNTER — Ambulatory Visit (INDEPENDENT_AMBULATORY_CARE_PROVIDER_SITE_OTHER): Payer: Medicare Other | Admitting: Women's Health

## 2016-09-04 ENCOUNTER — Encounter: Payer: Self-pay | Admitting: Women's Health

## 2016-09-04 VITALS — BP 110/80 | Ht 65.0 in | Wt 168.0 lb

## 2016-09-04 DIAGNOSIS — Z01419 Encounter for gynecological examination (general) (routine) without abnormal findings: Secondary | ICD-10-CM

## 2016-09-04 NOTE — Patient Instructions (Signed)

## 2016-09-04 NOTE — Progress Notes (Signed)
Kathy [name] Oct 02, 1951 [id number]    History:    Presents for breast and pelvic exam. Postmenopausal/no bleeding/no HRT. Normal Pap and mammogram history. 2013 negative colonoscopy. Has had both Zostavax and Pneumovax. Primary care manages hypothyroidism, osteopenia, hypercholesterolemia. Lost 20 pounds 2 years ago and has kept it off. Not sexually active.  Past medical history, past surgical history, family history and social history were all reviewed and documented in the EPIC chart. Retired Museum/gallery conservator.  2 children both doing well.  ROS:  A ROS was performed and pertinent positives and negatives are included.  Exam:  Vitals:   09/04/16 1403  BP: 110/80  Weight: 168 lb (76.2 kg)  Height: 5\' 5"  (1.651 m)   Body mass index is 27.96 kg/m.   General appearance:  Normal Thyroid:  Symmetrical, normal in size, without palpable masses or nodularity. Respiratory  Auscultation:  Clear without wheezing or rhonchi Cardiovascular  Auscultation:  Regular rate, without rubs, murmurs or gallops  Edema/varicosities:  Not grossly evident Abdominal  Soft,nontender, without masses, guarding or rebound.  Liver/spleen:  No organomegaly noted  Hernia:  None appreciated  Skin  Inspection:  Grossly normal   Breasts: Examined lying and sitting.     Right: Without masses, retractions, discharge or axillary adenopathy.     Left: Without masses, retractions, discharge or axillary adenopathy. Gentitourinary   Inguinal/mons:  Normal without inguinal adenopathy  External genitalia:  Normal  BUS/Urethra/Skene's glands:  Normal  Vagina:  Normal  Cervix:  Normal  Uterus: normal in size, shape and contour.  Midline and mobile  Adnexa/parametria:     Rt: Without masses or tenderness.   Lt: Without masses or tenderness.  Anus and perineum: Normal  Digital rectal exam: Normal sphincter tone without palpated masses or tenderness  Assessment/Plan:  65 y.o. M WF G2 P2 for breast and  pelvic exam with no complaints.  Postmenopausal/no bleeding/no HRT Hypothyroidism/osteopenia/hypercholesterolemia-primary care manages labs and meds  Plan: SBE's, continue annual screening mammogram, scheduled. Encouraged to increase regular weightbearing exercise, home safety and fall prevention discussed. Continue vitamin D supplement per primary care, calcium rich diet encouraged. UA, Pap normal with negative HR HPV 2014, new screening guidelines reviewed.    [name] [facility], 3:05 PM 09/04/2016

## 2016-09-05 DIAGNOSIS — L821 Other seborrheic keratosis: Secondary | ICD-10-CM | POA: Diagnosis not present

## 2016-09-05 DIAGNOSIS — D225 Melanocytic nevi of trunk: Secondary | ICD-10-CM | POA: Diagnosis not present

## 2016-09-05 DIAGNOSIS — L579 Skin changes due to chronic exposure to nonionizing radiation, unspecified: Secondary | ICD-10-CM | POA: Diagnosis not present

## 2016-09-05 DIAGNOSIS — L814 Other melanin hyperpigmentation: Secondary | ICD-10-CM | POA: Diagnosis not present

## 2016-09-06 LAB — CARDIO IQ(R) ADVANCED LIPID PANEL
Apolipoprotein B: 106 mg/dL — ABNORMAL HIGH (ref 49–103)
CHOLESTEROL, TOTAL (CARDIO IQ ADV LIPID PANEL): 216 mg/dL — AB (ref <200)
CHOLESTEROL/HDL RATIO (CARDIO IQ ADV LIPID PANEL): 3.3 calc (ref <5.0)
HDL CHOLESTEROL (CARDIO IQ ADV LIPID PANEL): 66 mg/dL (ref >50)
LDL CHOLESTEROL CALCULATED (CARDIO IQ ADV LIPID PANEL): 120 mg/dL — AB (ref <100)
LDL LARGE: 6717 nmol/L (ref 5038–17886)
LDL Medium: 352 nmol/L (ref 121–397)
LDL PARTICLE NUMBER: 1626 nmol/L (ref 1016–2185)
LDL Peak Size: 213.2 Angstrom — ABNORMAL LOW (ref >=218.2)
LDL Small: 407 nmol/L — ABNORMAL HIGH (ref 115–386)
LIPOPROTEIN (A) (CARDIO IQ ADV LIPID PANEL): 289 nmol/L — AB (ref <75)
NON-HDL CHOLESTEROL (CARDIO IQ ADV LIPID PANEL): 150 mg/dL — AB (ref <130)
TRIGLYCERIDES (CARDIO IQ ADV LIPID PANEL): 181 mg/dL — AB (ref <150)

## 2016-09-09 ENCOUNTER — Other Ambulatory Visit: Payer: Self-pay | Admitting: Pharmacist

## 2016-09-09 ENCOUNTER — Ambulatory Visit
Admission: RE | Admit: 2016-09-09 | Discharge: 2016-09-09 | Disposition: A | Discharge status: Home / Self Care | Payer: Medicare Other | Source: Ambulatory Visit | Attending: Women's Health | Admitting: Women's Health

## 2016-09-09 DIAGNOSIS — Z1231 Encounter for screening mammogram for malignant neoplasm of breast: Secondary | ICD-10-CM | POA: Diagnosis not present

## 2016-09-09 MED ORDER — ROSUVASTATIN CALCIUM 20 MG PO TABS: 20.0000 mg | ORAL_TABLET | Freq: Every day | ORAL | 3 refills | Status: DC

## 2016-09-10 DIAGNOSIS — Z0000 Encounter for general adult medical examination without abnormal findings: Secondary | ICD-10-CM | POA: Diagnosis not present

## 2016-09-10 DIAGNOSIS — E7800 Pure hypercholesterolemia, unspecified: Secondary | ICD-10-CM | POA: Diagnosis not present

## 2016-09-10 DIAGNOSIS — E039 Hypothyroidism, unspecified: Secondary | ICD-10-CM | POA: Diagnosis not present

## 2016-09-10 DIAGNOSIS — R7303 Prediabetes: Secondary | ICD-10-CM | POA: Diagnosis not present

## 2016-09-10 DIAGNOSIS — Z23 Encounter for immunization: Secondary | ICD-10-CM | POA: Diagnosis not present

## 2016-09-11 ENCOUNTER — Encounter: Payer: Self-pay | Admitting: Women's Health

## 2016-09-15 NOTE — Progress Notes (Signed)
Patient ID: Kathy [name], female   DOB: [birth year], 66 y.o.   MRN: [id number]     Cardiology Office Note   Date:  09/16/2016   ID:  Kathy [name], DOB Dec 22, 1950, MRN [id number]  PCP:  Shirline Frees, MD    No chief complaint on file. hyperlipidemia   Wt Readings from Last 3 Encounters:  09/16/16 77.7 kg (171 lb 6.4 oz)  09/04/16 76.2 kg (168 lb)  09/06/15 77.3 kg (170 lb 6.4 oz)       History of Present Illness: Kathy [name] is a 65 y.o. female  with a history of high cholesterol who has been intolerant of Zetia, Lipitor, Zocor. Crestor alone was tolerated but in combination with Zetia, she had severe stomach upset, vomiting. She has been managed in the lipid clinic. She has been on Lovaza and Crestor.  Denies : Chest pain. Dyspnea on exertion. Orthopnea. Shortness of breath. Leg edema. Dizziness.   She eats a healthy diet and walks regularly.  She feels that she gets at least 150 minutes of exercise /week.  Diet has not been as good.  Blood sugar has been higher.  She knows she needs to try to exercise more to help keep the blood sugar down.       Past Medical History:  Diagnosis Date  . CKD (chronic kidney disease), stage III   . Rosacea     Past Surgical History:  Procedure Laterality Date  . BTL    . PILONIDAL CYST / SINUS EXCISION    . THYROID SURGERY  2008   partial thyroidectomy     Current Outpatient Prescriptions  Medication Sig Dispense Refill  . ALPRAZolam (XANAX) 0.25 MG tablet Take 0.25 mg by mouth every 4 (four) hours as needed for anxiety.    [name] aspirin EC 81 MG tablet Take 81 mg by mouth daily.    . Biotin 1000 MCG tablet Take 1,000 mcg by mouth 2 (two) times daily.     . Coenzyme Q10 (CO Q 10) 100 MG CAPS Take 200 mg by mouth daily.    . Dermatological Products, Misc. (NUVAIL EX) Apply topically as directed.     [name] levothyroxine (SYNTHROID, LEVOTHROID) 88 MCG tablet Take 88 mcg by mouth daily.    . Multiple Vitamins-Minerals  (MULTIVITAMINS THER. W/MINERALS) TABS Take 1 tablet by mouth daily.    [name] omega-3 acid ethyl esters (LOVAZA) 1 G capsule TAKE 2 CAPSULES BY MOUTH 2 TIMES DAILY. 120 capsule 10  . rosuvastatin (CRESTOR) 20 MG tablet Take 1 tablet (20 mg total) by mouth daily. 90 tablet 3  . tretinoin (RETIN-A) 0.025 % cream Apply topically at bedtime.    [name] VITAMIN D, CHOLECALCIFEROL, PO Take 1 tablet by mouth daily.     No current facility-administered medications for this visit.     Allergies:   Lipitor [atorvastatin]; Zetia [ezetimibe]; and Zocor [simvastatin]    Social History:  The patient  reports that she has never smoked. She has never used smokeless tobacco. She reports that she drinks alcohol. She reports that she does not use drugs.   Family History:  The patient's family history includes AAA (abdominal aortic aneurysm) in her father; Diabetes Mellitus I in her mother; Heart failure in her other; Hypercholesterolemia in her father and mother; Prostate cancer in her father; Stroke in her other.    ROS:  Please see the history of present illness.   Otherwise, review of systems are positive for slightly elevated LFTs.  All other systems are reviewed and negative.    PHYSICAL EXAM: VS:  BP (!) 142/88   Pulse 95   Ht 5\' 5"  (1.651 m)   Wt 77.7 kg (171 lb 6.4 oz)   SpO2 95%   BMI 28.52 kg/m  , BMI Body mass index is 28.52 kg/m. GEN: Well nourished, well developed, in no acute distress  HEENT: normal  Neck: no JVD, carotid bruits, or masses Cardiac: RRR; no murmurs, rubs, or gallops,no edema  Respiratory:  clear to auscultation bilaterally, normal work of breathing GI: soft, nontender, nondistended, + BS MS: no deformity or atrophy  Skin: warm and dry, no rash Neuro:  Strength and sensation are intact Psych: euthymic mood, full affect   EKG:   The ekg ordered today demonstrates NSR, NSST   Recent Labs: 09/03/2016: ALT 20   Lipid Panel    Component Value Date/Time   CHOL 216 (H)  09/03/2016 0754   CHOL 214 (H) 03/08/2015 0755   TRIG 181 (H) 09/03/2016 0754   TRIG 148 03/08/2015 0755   HDL 66 09/03/2016 0754   HDL 70 03/08/2015 0755   CHOLHDL 3.3 09/03/2016 0754   LDLCALC 120 (H) 09/03/2016 0754   LDLCALC 114 (H) 03/08/2015 0755     Other studies Reviewed: Additional studies/ records that were reviewed today with results demonstrating: LDL 120 in 10/17.  Minimally elevated LFT in 4/16..   ASSESSMENT AND PLAN:  1. Hyperlipidemia: Continue Crestor and Lovaza. Crestor increased to 20 mg in 10/17 after that blood test.  Recheck lipids in 6 months. We talked about healthy diet and regular exercise to help keep her cholesterol down. 2. Elevated LFTs: Minimally elevated in the past.  Better in 10/17.  3. Statin intolerant: Intolerant of many different statins. Has tolerated Crestor. 4. Borderline diabetes: managed by PMD.  This is likely affecting the TG.  Increase exercise.  Add some weight training.  We discussed low level weights for large muscle groups.   Current medicines are reviewed at length with the patient today.  The patient concerns regarding her medicines were addressed.  The following changes have been made:  No change  Labs/ tests ordered today include:  No orders of the defined types were placed in this encounter.   Recommend 150 minutes/week of aerobic exercise Low fat, low carb, high fiber diet recommended  Disposition:   FU in one year   Signed, Larae Grooms, MD  09/16/2016 9:56 AM    [facility] [place], [place], [place]  [address] Phone: [phone]; Fax: [phone]

## 2016-09-16 ENCOUNTER — Ambulatory Visit (INDEPENDENT_AMBULATORY_CARE_PROVIDER_SITE_OTHER): Payer: Medicare Other | Admitting: Interventional Cardiology

## 2016-09-16 ENCOUNTER — Encounter: Payer: Self-pay | Admitting: Interventional Cardiology

## 2016-09-16 VITALS — BP 142/88 | HR 95 | Ht 65.0 in | Wt 171.4 lb

## 2016-09-16 DIAGNOSIS — R7989 Other specified abnormal findings of blood chemistry: Secondary | ICD-10-CM | POA: Diagnosis not present

## 2016-09-16 DIAGNOSIS — E7800 Pure hypercholesterolemia, unspecified: Secondary | ICD-10-CM | POA: Diagnosis not present

## 2016-09-16 DIAGNOSIS — R945 Abnormal results of liver function studies: Secondary | ICD-10-CM

## 2016-09-16 DIAGNOSIS — R7303 Prediabetes: Secondary | ICD-10-CM

## 2016-09-16 NOTE — Patient Instructions (Signed)

## 2016-11-10 ENCOUNTER — Encounter: Payer: Self-pay | Admitting: Interventional Cardiology

## 2016-11-13 ENCOUNTER — Other Ambulatory Visit: Payer: Self-pay

## 2016-11-13 ENCOUNTER — Encounter: Payer: Self-pay | Admitting: *Deleted

## 2016-11-13 MED ORDER — ROSUVASTATIN CALCIUM 10 MG PO TABS: 10.0000 mg | ORAL_TABLET | Freq: Every day | ORAL | 2 refills | Status: DC

## 2016-11-13 NOTE — Telephone Encounter (Signed)
This encounter was created in error - please disregard.

## 2016-11-21 DIAGNOSIS — J019 Acute sinusitis, unspecified: Secondary | ICD-10-CM | POA: Diagnosis not present

## 2016-11-21 DIAGNOSIS — Z20828 Contact with and (suspected) exposure to other viral communicable diseases: Secondary | ICD-10-CM | POA: Diagnosis not present

## 2017-03-26 ENCOUNTER — Encounter: Payer: Self-pay | Admitting: Gynecology

## 2017-06-24 ENCOUNTER — Telehealth: Payer: Self-pay | Admitting: Interventional Cardiology

## 2017-06-24 NOTE — Telephone Encounter (Signed)
New message     Pt states she occasionaly has cramping under her left breast---kinda severe when it happens.  No other symptoms.  Please advise

## 2017-06-24 NOTE — Telephone Encounter (Signed)
Spoke with patient and she c/o having bad cramps under her left breast that radiate down her left arm rated 8/10. Patient said that it has happened 3 times over the last 3 weeks and that the cramping lasts for about 3 minutes. Patient said her arm still felt a little tingly. No c/o dizziness chest pain or sob, n/v, headache. Patient said she took 2 baby aspirins when the cramp started and felt relief after 5 minutes. Patient does not have nitroglycerin at home. Patient said that she does not have any added stressors in her life at this time. Patient is able to do her own ADL's, and is driving. Patient offered the first available appointment with an extender but declined. Patient advised to contact her PCP about these symptoms and if her PCP felt her problem was cardiac related to have them contact our office for a sooner appointment. Patient sees Dr. Kenton Kingfisher with [facility]. Patient said she would contact her PCP for an appointment. Patient advised that if her symptoms got worse that she needed to go to the ED for an evaluation. Patient verbalized understanding of plan.

## 2017-07-08 ENCOUNTER — Telehealth: Payer: Self-pay | Admitting: Interventional Cardiology

## 2017-07-08 NOTE — Telephone Encounter (Signed)
New message    Pt scheduled her yearly appt, she wants to know if she needs lab work done the week before the appt. Please call.

## 2017-07-10 NOTE — Telephone Encounter (Signed)
Left message for patient to call back  

## 2017-07-11 NOTE — Telephone Encounter (Signed)
F/U Call:  Patient returning call to [place]. Will be out of town today and would like to verify if she needs labs.

## 2017-07-11 NOTE — Telephone Encounter (Signed)
Patient will be due for lipid and liver panel at time of her appt with Dr. Irish Lack. Patient can have these labs done at office visit. Will forward to Dr. Hassell Done nurse in case they want to have lab work done earlier.

## 2017-07-16 ENCOUNTER — Encounter: Payer: Self-pay | Admitting: Interventional Cardiology

## 2017-07-16 NOTE — Telephone Encounter (Signed)
Left detailed message on patient's VM (DPR on file) to call back or send my chart message on what day she wants to come in for fasting labs prior to her appointment.

## 2017-07-17 ENCOUNTER — Other Ambulatory Visit: Payer: Self-pay

## 2017-07-17 DIAGNOSIS — E7800 Pure hypercholesterolemia, unspecified: Secondary | ICD-10-CM

## 2017-08-20 DIAGNOSIS — Z23 Encounter for immunization: Secondary | ICD-10-CM | POA: Diagnosis not present

## 2017-08-26 ENCOUNTER — Other Ambulatory Visit: Payer: Self-pay | Admitting: Women's Health

## 2017-08-26 DIAGNOSIS — Z1231 Encounter for screening mammogram for malignant neoplasm of breast: Secondary | ICD-10-CM

## 2017-09-05 ENCOUNTER — Encounter: Payer: Medicare Other | Admitting: Women's Health

## 2017-09-17 ENCOUNTER — Encounter: Payer: Self-pay | Admitting: Women's Health

## 2017-09-17 ENCOUNTER — Ambulatory Visit
Admission: RE | Admit: 2017-09-17 | Discharge: 2017-09-17 | Disposition: A | Discharge status: Home / Self Care | Payer: Medicare Other | Source: Ambulatory Visit | Attending: Women's Health | Admitting: Women's Health

## 2017-09-17 DIAGNOSIS — Z1231 Encounter for screening mammogram for malignant neoplasm of breast: Secondary | ICD-10-CM | POA: Diagnosis not present

## 2017-09-26 ENCOUNTER — Other Ambulatory Visit: Payer: Self-pay | Admitting: Interventional Cardiology

## 2017-10-14 ENCOUNTER — Encounter: Payer: Medicare Other | Admitting: Women's Health

## 2017-10-20 ENCOUNTER — Other Ambulatory Visit: Payer: Medicare Other

## 2017-10-23 ENCOUNTER — Other Ambulatory Visit: Payer: Medicare Other

## 2017-10-27 ENCOUNTER — Ambulatory Visit: Payer: Medicare Other | Admitting: Interventional Cardiology

## 2017-12-04 ENCOUNTER — Other Ambulatory Visit: Payer: Medicare Other

## 2017-12-04 DIAGNOSIS — E7800 Pure hypercholesterolemia, unspecified: Secondary | ICD-10-CM | POA: Diagnosis not present

## 2017-12-04 LAB — LIPID PANEL
CHOL/HDL RATIO: 2.8 ratio (ref 0.0–4.4)
Cholesterol, Total: 200 mg/dL — ABNORMAL HIGH (ref 100–199)
HDL: 72 mg/dL (ref >39)
LDL CALC: 96 mg/dL (ref 0–99)
TRIGLYCERIDES: 159 mg/dL — AB (ref 0–149)
VLDL Cholesterol Cal: 32 mg/dL (ref 5–40)

## 2017-12-04 LAB — HEPATIC FUNCTION PANEL
ALBUMIN: 4.5 g/dL (ref 3.6–4.8)
ALK PHOS: 73 IU/L (ref 39–117)
ALT: 34 IU/L — ABNORMAL HIGH (ref 0–32)
AST: 48 IU/L — ABNORMAL HIGH (ref 0–40)
Bilirubin Total: 0.5 mg/dL (ref 0.0–1.2)
Bilirubin, Direct: 0.13 mg/dL (ref 0.00–0.40)
TOTAL PROTEIN: 6.8 g/dL (ref 6.0–8.5)

## 2017-12-09 ENCOUNTER — Telehealth: Payer: Self-pay | Admitting: Interventional Cardiology

## 2017-12-09 NOTE — Telephone Encounter (Signed)
-----   Message from Leeroy Bock, [facility] sent at 12/09/2017  7:34 AM EST ----- LDL at goal < 130 on Crestor 10mg  daily. TG minimally elevated on Lovaza 4g daily. LFTs mildly elevated as they have been in the past. Would recommend continuing current therapy and advising pt to limit intake of sweets and alcohol to keep TG close to goal.

## 2017-12-09 NOTE — Telephone Encounter (Signed)
Patient made aware of results and recommendations to continue current meds and limit her sweets and alcohol consumption. Patient verbalizes understanding and thanked me for the call.

## 2017-12-09 NOTE — Telephone Encounter (Signed)
New message    Patient returning call from nurse. Patient requesting lab results

## 2017-12-11 ENCOUNTER — Encounter: Payer: Self-pay | Admitting: Interventional Cardiology

## 2017-12-11 ENCOUNTER — Ambulatory Visit (INDEPENDENT_AMBULATORY_CARE_PROVIDER_SITE_OTHER): Payer: Medicare Other | Admitting: Interventional Cardiology

## 2017-12-11 VITALS — BP 110/78 | HR 75 | Ht 65.0 in | Wt 173.0 lb

## 2017-12-11 DIAGNOSIS — R7989 Other specified abnormal findings of blood chemistry: Secondary | ICD-10-CM

## 2017-12-11 DIAGNOSIS — E7800 Pure hypercholesterolemia, unspecified: Secondary | ICD-10-CM | POA: Diagnosis not present

## 2017-12-11 DIAGNOSIS — R7303 Prediabetes: Secondary | ICD-10-CM

## 2017-12-11 DIAGNOSIS — R945 Abnormal results of liver function studies: Secondary | ICD-10-CM

## 2017-12-11 MED ORDER — ROSUVASTATIN CALCIUM 10 MG PO TABS: 10.0000 mg | ORAL_TABLET | Freq: Every day | ORAL | 3 refills | Status: DC

## 2017-12-11 MED ORDER — OMEGA-3-ACID ETHYL ESTERS 1 G PO CAPS: ORAL_CAPSULE | ORAL | 11 refills | Status: DC

## 2017-12-11 NOTE — Progress Notes (Signed)
Cardiology Office Note   Date:  12/11/2017   ID:  Kathy [name], DOB December 09, 1950, MRN [id number]  PCP:  Shirline Frees, MD    No chief complaint on file. hyperlipidemia   Wt Readings from Last 3 Encounters:  12/11/17 173 lb (78.5 kg)  09/16/16 171 lb 6.4 oz (77.7 kg)  09/04/16 168 lb (76.2 kg)       History of Present Illness: Kathy [name] is a 67 y.o. female  with a history of high cholesterol who has been intolerant of Zetia, Lipitor, Zocor. Crestor alone was tolerated but in combination with Zetia, she had severe stomach upset, vomiting. She has been managed in the lipid clinic.   She has been on Lovaza and Crestor 10 mg.  With 20 mg of Crestor, she had nausea.  She had nausea with Zetia.    Denies : Chest pain. Dizziness. Leg edema. Nitroglycerin use. Orthopnea. Palpitations. Paroxysmal nocturnal dyspnea. Shortness of breath. Syncope.    She has not been exercising much.      Past Medical History:  Diagnosis Date  . CKD (chronic kidney disease), stage III ([place])   . Rosacea     Past Surgical History:  Procedure Laterality Date  . BTL    . PILONIDAL CYST / SINUS EXCISION    . THYROID SURGERY  2008   partial thyroidectomy     Current Outpatient Medications  Medication Sig Dispense Refill  . ALPRAZolam (XANAX) 0.25 MG tablet Take 0.25 mg by mouth every 4 (four) hours as needed for anxiety.    [name] aspirin EC 81 MG tablet Take 81 mg by mouth daily.    . Biotin 1000 MCG tablet Take 1,000 mcg by mouth 2 (two) times daily.     . Coenzyme Q10 (CO Q 10) 100 MG CAPS Take 200 mg by mouth daily.    . Dermatological Products, Misc. (NUVAIL EX) Apply topically as directed.     [name] levothyroxine (SYNTHROID, LEVOTHROID) 88 MCG tablet Take 88 mcg by mouth daily.    . Multiple Vitamins-Minerals (MULTIVITAMINS THER. W/MINERALS) TABS Take 1 tablet by mouth daily.    [name] omega-3 acid ethyl esters (LOVAZA) 1 G capsule TAKE 2 CAPSULES BY MOUTH 2 TIMES DAILY. 120 capsule 10  .  rosuvastatin (CRESTOR) 10 MG tablet Take 1 tablet (10 mg total) daily by mouth. Please keep upcoming appt for future refills. Thanks 90 tablet 0  . tretinoin (RETIN-A) 0.025 % cream Apply topically at bedtime.    [name] VITAMIN D, CHOLECALCIFEROL, PO Take 1 tablet by mouth daily.     No current facility-administered medications for this visit.     Allergies:   Lipitor [atorvastatin]; Zetia [ezetimibe]; and Zocor [simvastatin]    Social History:  The patient  reports that  has never smoked. she has never used smokeless tobacco. She reports that she drinks alcohol. She reports that she does not use drugs.   Family History:  The patient's family history includes AAA (abdominal aortic aneurysm) in her father; Diabetes Mellitus I in her mother; Heart failure in her other; Hypercholesterolemia in her father and mother; Prostate cancer in her father; Stroke in her other.    ROS:  Please see the history of present illness.   Otherwise, review of systems are positive for back pain.   All other systems are reviewed and negative.    PHYSICAL EXAM: VS:  BP 110/78   Pulse 75   Ht 5\' 5"  (1.651 m)   Wt 173 lb (  78.5 kg)   SpO2 95%   BMI 28.79 kg/m  , BMI Body mass index is 28.79 kg/m. GEN: Well nourished, well developed, in no acute distress  HEENT: normal  Neck: no JVD, carotid bruits, or masses Cardiac: RRR; no murmurs, rubs, or gallops,no edema  Respiratory:  clear to auscultation bilaterally, normal work of breathing GI: soft, nontender, nondistended, + BS MS: no deformity or atrophy  Skin: warm and dry, no rash Neuro:  Strength and sensation are intact Psych: euthymic mood, full affect   EKG:   The ekg ordered today demonstrates NSR, poor R wave progression, no ST changes, no change compared to 2017 ECG   Recent Labs: 12/04/2017: ALT 34   Lipid Panel    Component Value Date/Time   CHOL 200 (H) 12/04/2017 0909   CHOL 216 (H) 09/03/2016 0754   CHOL 214 (H) 03/08/2015 0755   TRIG 159  (H) 12/04/2017 0909   TRIG 181 (H) 09/03/2016 0754   TRIG 148 03/08/2015 0755   HDL 72 12/04/2017 0909   HDL 66 09/03/2016 0754   HDL 70 03/08/2015 0755   CHOLHDL 2.8 12/04/2017 0909   CHOLHDL 3.3 09/03/2016 0754   LDLCALC 96 12/04/2017 0909   LDLCALC 120 (H) 09/03/2016 0754   LDLCALC 114 (H) 03/08/2015 0755     Other studies Reviewed: Additional studies/ records that were reviewed today with results demonstrating: lipids and LFTs reviewed.   ASSESSMENT AND PLAN:  1. Hyperlipidemia: LDL < 100 on Crestor 10 mg daily with Lovaza.   2. Elevated LFTs: Slightly elevated.  To be rechecked with PMD.  3. Statin intolerant: Did not tolerate higher dose of Crestor due to nausea. 4. Borderline DM:  This may affect TG. COntinue Lovaza.  Increase exercise as well as noted below.    Current medicines are reviewed at length with the patient today.  The patient concerns regarding her medicines were addressed.  The following changes have been made:  No change  Labs/ tests ordered today include:  No orders of the defined types were placed in this encounter.   Recommend 150 minutes/week of aerobic exercise Low fat, low carb, high fiber diet recommended  Disposition:   FU in 1 year   Signed, Larae Grooms, MD  12/11/2017 3:31 PM    [facility] [place], [place], [place]  [address] Phone: [phone]; Fax: [phone]

## 2017-12-11 NOTE — Patient Instructions (Signed)

## 2017-12-16 ENCOUNTER — Ambulatory Visit (INDEPENDENT_AMBULATORY_CARE_PROVIDER_SITE_OTHER): Payer: Medicare Other | Admitting: Women's Health

## 2017-12-16 ENCOUNTER — Encounter: Payer: Self-pay | Admitting: Women's Health

## 2017-12-16 VITALS — BP 120/78 | Ht 65.0 in | Wt 171.0 lb

## 2017-12-16 DIAGNOSIS — N952 Postmenopausal atrophic vaginitis: Secondary | ICD-10-CM

## 2017-12-16 DIAGNOSIS — Z01419 Encounter for gynecological examination (general) (routine) without abnormal findings: Secondary | ICD-10-CM | POA: Diagnosis not present

## 2017-12-16 NOTE — Patient Instructions (Addendum)
Health Maintenance for Postmenopausal Women Menopause is a normal process in which your reproductive ability comes to an end. This process happens gradually over a span of months to years, usually between the ages of 22 and 9. Menopause is complete when you have missed 12 consecutive menstrual periods. It is important to talk with your health care provider about some of the most common conditions that affect postmenopausal women, such as heart disease, cancer, and bone loss (osteoporosis). Adopting a healthy lifestyle and getting preventive care can help to promote your health and wellness. Those actions can also lower your chances of developing some of these common conditions. What should I know about menopause? During menopause, you may experience a number of symptoms, such as:  Moderate-to-severe hot flashes.  Night sweats.  Decrease in sex drive.  Mood swings.  Headaches.  Tiredness.  Irritability.  Memory problems.  Insomnia.  Choosing to treat or not to treat menopausal changes is an individual decision that you make with your health care provider. What should I know about hormone replacement therapy and supplements? Hormone therapy products are effective for treating symptoms that are associated with menopause, such as hot flashes and night sweats. Hormone replacement carries certain risks, especially as you become older. If you are thinking about using estrogen or estrogen with progestin treatments, discuss the benefits and risks with your health care provider. What should I know about heart disease and stroke? Heart disease, heart attack, and stroke become more likely as you age. This may be due, in part, to the hormonal changes that your body experiences during menopause. These can affect how your body processes dietary fats, triglycerides, and cholesterol. Heart attack and stroke are both medical emergencies. There are many things that you can do to help prevent heart disease  and stroke:  Have your blood pressure checked at least every 1-2 years. High blood pressure causes heart disease and increases the risk of stroke.  If you are 53-22 years old, ask your health care provider if you should take aspirin to prevent a heart attack or a stroke.  Do not use any tobacco products, including cigarettes, chewing tobacco, or electronic cigarettes. If you need help quitting, ask your health care provider.  It is important to eat a healthy diet and maintain a healthy weight. ? Be sure to include plenty of vegetables, fruits, low-fat dairy products, and lean protein. ? Avoid eating foods that are high in solid fats, added sugars, or salt (sodium).  Get regular exercise. This is one of the most important things that you can do for your health. ? Try to exercise for at least 150 minutes each week. The type of exercise that you do should increase your heart rate and make you sweat. This is known as moderate-intensity exercise. ? Try to do strengthening exercises at least twice each week. Do these in addition to the moderate-intensity exercise.  Know your numbers.Ask your health care provider to check your cholesterol and your blood glucose. Continue to have your blood tested as directed by your health care provider.  What should I know about cancer screening? There are several types of cancer. Take the following steps to reduce your risk and to catch any cancer development as early as possible. Breast Cancer  Practice breast self-awareness. ? This means understanding how your breasts normally appear and feel. ? It also means doing regular breast self-exams. Let your health care provider know about any changes, no matter how small.  If you are 40  or older, have a clinician do a breast exam (clinical breast exam or CBE) every year. Depending on your age, family history, and medical history, it may be recommended that you also have a yearly breast X-ray (mammogram).  If you  have a family history of breast cancer, talk with your health care provider about genetic screening.  If you are at high risk for breast cancer, talk with your health care provider about having an MRI and a mammogram every year.  Breast cancer (BRCA) gene test is recommended for women who have family members with BRCA-related cancers. Results of the assessment will determine the need for genetic counseling and BRCA1 and for BRCA2 testing. BRCA-related cancers include these types: ? Breast. This occurs in males or females. ? Ovarian. ? Tubal. This may also be called fallopian tube cancer. ? Cancer of the abdominal or pelvic lining (peritoneal cancer). ? Prostate. ? Pancreatic.  Cervical, Uterine, and Ovarian Cancer Your health care provider may recommend that you be screened regularly for cancer of the pelvic organs. These include your ovaries, uterus, and vagina. This screening involves a pelvic exam, which includes checking for microscopic changes to the surface of your cervix (Pap test).  For women ages 21-65, health care providers may recommend a pelvic exam and a Pap test every three years. For women ages 79-65, they may recommend the Pap test and pelvic exam, combined with testing for human papilloma virus (HPV), every five years. Some types of HPV increase your risk of cervical cancer. Testing for HPV may also be done on women of any age who have unclear Pap test results.  Other health care providers may not recommend any screening for nonpregnant women who are considered low risk for pelvic cancer and have no symptoms. Ask your health care provider if a screening pelvic exam is right for you.  If you have had past treatment for cervical cancer or a condition that could lead to cancer, you need Pap tests and screening for cancer for at least 20 years after your treatment. If Pap tests have been discontinued for you, your risk factors (such as having a new sexual partner) need to be  reassessed to determine if you should start having screenings again. Some women have medical problems that increase the chance of getting cervical cancer. In these cases, your health care provider may recommend that you have screening and Pap tests more often.  If you have a family history of uterine cancer or ovarian cancer, talk with your health care provider about genetic screening.  If you have vaginal bleeding after reaching menopause, tell your health care provider.  There are currently no reliable tests available to screen for ovarian cancer.  Lung Cancer Lung cancer screening is recommended for adults 69-62 years old who are at high risk for lung cancer because of a history of smoking. A yearly low-dose CT scan of the lungs is recommended if you:  Currently smoke.  Have a history of at least 30 pack-years of smoking and you currently smoke or have quit within the past 15 years. A pack-year is smoking an average of one pack of cigarettes per day for one year.  Yearly screening should:  Continue until it has been 15 years since you quit.  Stop if you develop a health problem that would prevent you from having lung cancer treatment.  Colorectal Cancer  This type of cancer can be detected and can often be prevented.  Routine colorectal cancer screening usually begins at  age 73 and continues through age 36.  If you have risk factors for colon cancer, your health care provider may recommend that you be screened at an earlier age.  If you have a family history of colorectal cancer, talk with your health care provider about genetic screening.  Your health care provider may also recommend using home test kits to check for hidden blood in your stool.  A small camera at the end of a tube can be used to examine your colon directly (sigmoidoscopy or colonoscopy). This is done to check for the earliest forms of colorectal cancer.  Direct examination of the colon should be repeated every  5-10 years until age 52. However, if early forms of precancerous polyps or small growths are found or if you have a family history or genetic risk for colorectal cancer, you may need to be screened more often.  Skin Cancer  Check your skin from head to toe regularly.  Monitor any moles. Be sure to tell your health care provider: ? About any new moles or changes in moles, especially if there is a change in a mole's shape or color. ? If you have a mole that is larger than the size of a pencil eraser.  If any of your family members has a history of skin cancer, especially at a Treasa Bradshaw age, talk with your health care provider about genetic screening.  Always use sunscreen. Apply sunscreen liberally and repeatedly throughout the day.  Whenever you are outside, protect yourself by wearing long sleeves, pants, a wide-brimmed hat, and sunglasses.  What should I know about osteoporosis? Osteoporosis is a condition in which bone destruction happens more quickly than new bone creation. After menopause, you may be at an increased risk for osteoporosis. To help prevent osteoporosis or the bone fractures that can happen because of osteoporosis, the following is recommended:  If you are 28-48 years old, get at least 1,000 mg of calcium and at least 600 mg of vitamin D per day.  If you are older than age 67 but younger than age 13, get at least 1,200 mg of calcium and at least 600 mg of vitamin D per day.  If you are older than age 6, get at least 1,200 mg of calcium and at least 800 mg of vitamin D per day.  Smoking and excessive alcohol intake increase the risk of osteoporosis. Eat foods that are rich in calcium and vitamin D, and do weight-bearing exercises several times each week as directed by your health care provider. What should I know about how menopause affects my mental health? Depression may occur at any age, but it is more common as you become older. Common symptoms of depression  include:  Low or sad mood.  Changes in sleep patterns.  Changes in appetite or eating patterns.  Feeling an overall lack of motivation or enjoyment of activities that you previously enjoyed.  Frequent crying spells.  Talk with your health care provider if you think that you are experiencing depression. What should I know about immunizations? It is important that you get and maintain your immunizations. These include:  Tetanus, diphtheria, and pertussis (Tdap) booster vaccine.  Influenza every year before the flu season begins.  Pneumonia vaccine.  Shingles vaccine.  Your health care provider may also recommend other immunizations. This information is not intended to replace advice given to you by your health care provider. Make sure you discuss any questions you have with your health care provider. Document Released: 12/20/2005  Document Revised: 05/17/2016 Document Reviewed: 08/01/2015 Elsevier Interactive Patient Education  2018 [place] Maintenance for Postmenopausal Women Menopause is a normal process in which your reproductive ability comes to an end. This process happens gradually over a span of months to years, usually between the ages of 63 and 58. Menopause is complete when you have missed 12 consecutive menstrual periods. It is important to talk with your health care provider about some of the most common conditions that affect postmenopausal women, such as heart disease, cancer, and bone loss (osteoporosis). Adopting a healthy lifestyle and getting preventive care can help to promote your health and wellness. Those actions can also lower your chances of developing some of these common conditions. What should I know about menopause? During menopause, you may experience a number of symptoms, such as:  Moderate-to-severe hot flashes.  Night sweats.  Decrease in sex drive.  Mood swings.  Headaches.  Tiredness.  Irritability.  Memory  problems.  Insomnia.  Choosing to treat or not to treat menopausal changes is an individual decision that you make with your health care provider. What should I know about hormone replacement therapy and supplements? Hormone therapy products are effective for treating symptoms that are associated with menopause, such as hot flashes and night sweats. Hormone replacement carries certain risks, especially as you become older. If you are thinking about using estrogen or estrogen with progestin treatments, discuss the benefits and risks with your health care provider. What should I know about heart disease and stroke? Heart disease, heart attack, and stroke become more likely as you age. This may be due, in part, to the hormonal changes that your body experiences during menopause. These can affect how your body processes dietary fats, triglycerides, and cholesterol. Heart attack and stroke are both medical emergencies. There are many things that you can do to help prevent heart disease and stroke:  Have your blood pressure checked at least every 1-2 years. High blood pressure causes heart disease and increases the risk of stroke.  If you are 94-57 years old, ask your health care provider if you should take aspirin to prevent a heart attack or a stroke.  Do not use any tobacco products, including cigarettes, chewing tobacco, or electronic cigarettes. If you need help quitting, ask your health care provider.  It is important to eat a healthy diet and maintain a healthy weight. ? Be sure to include plenty of vegetables, fruits, low-fat dairy products, and lean protein. ? Avoid eating foods that are high in solid fats, added sugars, or salt (sodium).  Get regular exercise. This is one of the most important things that you can do for your health. ? Try to exercise for at least 150 minutes each week. The type of exercise that you do should increase your heart rate and make you sweat. This is known as  moderate-intensity exercise. ? Try to do strengthening exercises at least twice each week. Do these in addition to the moderate-intensity exercise.  Know your numbers.Ask your health care provider to check your cholesterol and your blood glucose. Continue to have your blood tested as directed by your health care provider.  What should I know about cancer screening? There are several types of cancer. Take the following steps to reduce your risk and to catch any cancer development as early as possible. Breast Cancer  Practice breast self-awareness. ? This means understanding how your breasts normally appear and feel. ? It also means doing regular breast self-exams. Let your  health care provider know about any changes, no matter how small.  If you are 39 or older, have a clinician do a breast exam (clinical breast exam or CBE) every year. Depending on your age, family history, and medical history, it may be recommended that you also have a yearly breast X-ray (mammogram).  If you have a family history of breast cancer, talk with your health care provider about genetic screening.  If you are at high risk for breast cancer, talk with your health care provider about having an MRI and a mammogram every year.  Breast cancer (BRCA) gene test is recommended for women who have family members with BRCA-related cancers. Results of the assessment will determine the need for genetic counseling and BRCA1 and for BRCA2 testing. BRCA-related cancers include these types: ? Breast. This occurs in males or females. ? Ovarian. ? Tubal. This may also be called fallopian tube cancer. ? Cancer of the abdominal or pelvic lining (peritoneal cancer). ? Prostate. ? Pancreatic.  Cervical, Uterine, and Ovarian Cancer Your health care provider may recommend that you be screened regularly for cancer of the pelvic organs. These include your ovaries, uterus, and vagina. This screening involves a pelvic exam, which  includes checking for microscopic changes to the surface of your cervix (Pap test).  For women ages 21-65, health care providers may recommend a pelvic exam and a Pap test every three years. For women ages 45-65, they may recommend the Pap test and pelvic exam, combined with testing for human papilloma virus (HPV), every five years. Some types of HPV increase your risk of cervical cancer. Testing for HPV may also be done on women of any age who have unclear Pap test results.  Other health care providers may not recommend any screening for nonpregnant women who are considered low risk for pelvic cancer and have no symptoms. Ask your health care provider if a screening pelvic exam is right for you.  If you have had past treatment for cervical cancer or a condition that could lead to cancer, you need Pap tests and screening for cancer for at least 20 years after your treatment. If Pap tests have been discontinued for you, your risk factors (such as having a new sexual partner) need to be reassessed to determine if you should start having screenings again. Some women have medical problems that increase the chance of getting cervical cancer. In these cases, your health care provider may recommend that you have screening and Pap tests more often.  If you have a family history of uterine cancer or ovarian cancer, talk with your health care provider about genetic screening.  If you have vaginal bleeding after reaching menopause, tell your health care provider.  There are currently no reliable tests available to screen for ovarian cancer.  Lung Cancer Lung cancer screening is recommended for adults 51-83 years old who are at high risk for lung cancer because of a history of smoking. A yearly low-dose CT scan of the lungs is recommended if you:  Currently smoke.  Have a history of at least 30 pack-years of smoking and you currently smoke or have quit within the past 15 years. A pack-year is smoking an  average of one pack of cigarettes per day for one year.  Yearly screening should:  Continue until it has been 15 years since you quit.  Stop if you develop a health problem that would prevent you from having lung cancer treatment.  Colorectal Cancer  This type of cancer  can be detected and can often be prevented.  Routine colorectal cancer screening usually begins at age 1 and continues through age 40.  If you have risk factors for colon cancer, your health care provider may recommend that you be screened at an earlier age.  If you have a family history of colorectal cancer, talk with your health care provider about genetic screening.  Your health care provider may also recommend using home test kits to check for hidden blood in your stool.  A small camera at the end of a tube can be used to examine your colon directly (sigmoidoscopy or colonoscopy). This is done to check for the earliest forms of colorectal cancer.  Direct examination of the colon should be repeated every 5-10 years until age 70. However, if early forms of precancerous polyps or small growths are found or if you have a family history or genetic risk for colorectal cancer, you may need to be screened more often.  Skin Cancer  Check your skin from head to toe regularly.  Monitor any moles. Be sure to tell your health care provider: ? About any new moles or changes in moles, especially if there is a change in a mole's shape or color. ? If you have a mole that is larger than the size of a pencil eraser.  If any of your family members has a history of skin cancer, especially at a Staci Carver age, talk with your health care provider about genetic screening.  Always use sunscreen. Apply sunscreen liberally and repeatedly throughout the day.  Whenever you are outside, protect yourself by wearing long sleeves, pants, a wide-brimmed hat, and sunglasses.  What should I know about osteoporosis? Osteoporosis is a condition in  which bone destruction happens more quickly than new bone creation. After menopause, you may be at an increased risk for osteoporosis. To help prevent osteoporosis or the bone fractures that can happen because of osteoporosis, the following is recommended:  If you are 21-36 years old, get at least 1,000 mg of calcium and at least 600 mg of vitamin D per day.  If you are older than age 87 but younger than age 28, get at least 1,200 mg of calcium and at least 600 mg of vitamin D per day.  If you are older than age 66, get at least 1,200 mg of calcium and at least 800 mg of vitamin D per day.  Smoking and excessive alcohol intake increase the risk of osteoporosis. Eat foods that are rich in calcium and vitamin D, and do weight-bearing exercises several times each week as directed by your health care provider. What should I know about how menopause affects my mental health? Depression may occur at any age, but it is more common as you become older. Common symptoms of depression include:  Low or sad mood.  Changes in sleep patterns.  Changes in appetite or eating patterns.  Feeling an overall lack of motivation or enjoyment of activities that you previously enjoyed.  Frequent crying spells.  Talk with your health care provider if you think that you are experiencing depression. What should I know about immunizations? It is important that you get and maintain your immunizations. These include:  Tetanus, diphtheria, and pertussis (Tdap) booster vaccine.  Influenza every year before the flu season begins.  Pneumonia vaccine.  Shingles vaccine.  Your health care provider may also recommend other immunizations. This information is not intended to replace advice given to you by your health care provider. Make  sure you discuss any questions you have with your health care provider. Document Released: 12/20/2005 Document Revised: 05/17/2016 Document Reviewed: 08/01/2015 Elsevier Interactive  Patient Education  2018 [employer].

## 2017-12-16 NOTE — Progress Notes (Signed)
Kathy [name] Apr 20, 1951 [id number]    History:    Presents for breast and pelvic exam. Postmenopausal on no HRT with no bleeding. Normal Pap and mammogram history. 2013 negative colonoscopy. Has had Zostavax and Pneumovax. Primary care manages hypercholesterolemia, hypothyroidism. Osteopenia primary care manages has follow-up scheduled. Not sexually active husbands health.  Past medical history, past surgical history, family history and social history were all reviewed and documented in the EPIC chart. Retired Engineer, manufacturing systems. 2 children both doing well and 1 stepdaughter. Lost 20 pounds several years ago and has maintained.  ROS:  A ROS was performed and pertinent positives and negatives are included.  Exam:  Vitals:   12/16/17 0900  BP: 120/78  Weight: 171 lb (77.6 kg)  Height: 5\' 5"  (1.651 m)   Body mass index is 28.46 kg/m.   General appearance:  Normal Thyroid:  Symmetrical, normal in size, without palpable masses or nodularity. Respiratory  Auscultation:  Clear without wheezing or rhonchi Cardiovascular  Auscultation:  Regular rate, without rubs, murmurs or gallops  Edema/varicosities:  Not grossly evident Abdominal  Soft,nontender, without masses, guarding or rebound.  Liver/spleen:  No organomegaly noted  Hernia:  None appreciated  Skin  Inspection:  Grossly normal   Breasts: Examined lying and sitting.     Right: Without masses, retractions, discharge or axillary adenopathy.     Left: Without masses, retractions, discharge or axillary adenopathy. Gentitourinary   Inguinal/mons:  Normal without inguinal adenopathy  External genitalia:  Normal  BUS/Urethra/Skene's glands:  Normal  Vagina:  Mild atrophy Cervix:  Normal  Uterus:   normal in size, shape and contour.  Midline and mobile  Adnexa/parametria:     Rt: Without masses or tenderness.   Lt: Without masses or tenderness.  Anus and perineum: Normal  Digital rectal exam: Normal sphincter tone without  palpated masses or tenderness  Assessment/Plan:  67 y.o. M WF G2 P2 for breast and pelvic exam with no complaints.  Postmenopausal/no HRT/no bleeding/mild vaginal atrophy Hypercholesteremia, hyperthyroidism-primary care manages Osteopenia-primary care manages  Plan: SBE's, continue annual screening mammogram, calcium rich diet, vitamin D 2000 daily encouraged. Reviewed importance of continuing regular exercise, balanced type exercise reviewed and encouraged yoga. Home safety and fall prevention discussed. Vaginal lubricants as needed for vaginal itching/dryness. Shingrex reviewed will follow-up with primary care.    [name] [facility], 11:06 AM 12/16/2017

## 2017-12-24 DIAGNOSIS — D1801 Hemangioma of skin and subcutaneous tissue: Secondary | ICD-10-CM | POA: Diagnosis not present

## 2017-12-24 DIAGNOSIS — L814 Other melanin hyperpigmentation: Secondary | ICD-10-CM | POA: Diagnosis not present

## 2017-12-24 DIAGNOSIS — D225 Melanocytic nevi of trunk: Secondary | ICD-10-CM | POA: Diagnosis not present

## 2017-12-30 DIAGNOSIS — E039 Hypothyroidism, unspecified: Secondary | ICD-10-CM | POA: Diagnosis not present

## 2017-12-30 DIAGNOSIS — R7303 Prediabetes: Secondary | ICD-10-CM | POA: Diagnosis not present

## 2017-12-30 DIAGNOSIS — E7800 Pure hypercholesterolemia, unspecified: Secondary | ICD-10-CM | POA: Diagnosis not present

## 2017-12-30 DIAGNOSIS — E559 Vitamin D deficiency, unspecified: Secondary | ICD-10-CM | POA: Diagnosis not present

## 2017-12-30 DIAGNOSIS — R739 Hyperglycemia, unspecified: Secondary | ICD-10-CM | POA: Diagnosis not present

## 2017-12-30 DIAGNOSIS — N183 Chronic kidney disease, stage 3 (moderate): Secondary | ICD-10-CM | POA: Diagnosis not present

## 2018-01-05 DIAGNOSIS — M85851 Other specified disorders of bone density and structure, right thigh: Secondary | ICD-10-CM | POA: Diagnosis not present

## 2018-01-05 DIAGNOSIS — E559 Vitamin D deficiency, unspecified: Secondary | ICD-10-CM | POA: Diagnosis not present

## 2018-01-05 DIAGNOSIS — Z0000 Encounter for general adult medical examination without abnormal findings: Secondary | ICD-10-CM | POA: Diagnosis not present

## 2018-01-05 DIAGNOSIS — N183 Chronic kidney disease, stage 3 (moderate): Secondary | ICD-10-CM | POA: Diagnosis not present

## 2018-01-05 DIAGNOSIS — E039 Hypothyroidism, unspecified: Secondary | ICD-10-CM | POA: Diagnosis not present

## 2018-01-05 DIAGNOSIS — F5101 Primary insomnia: Secondary | ICD-10-CM | POA: Diagnosis not present

## 2018-01-05 DIAGNOSIS — R7303 Prediabetes: Secondary | ICD-10-CM | POA: Diagnosis not present

## 2018-01-05 DIAGNOSIS — E7800 Pure hypercholesterolemia, unspecified: Secondary | ICD-10-CM | POA: Diagnosis not present

## 2018-01-05 DIAGNOSIS — F411 Generalized anxiety disorder: Secondary | ICD-10-CM | POA: Diagnosis not present

## 2018-02-03 ENCOUNTER — Encounter: Payer: Self-pay | Admitting: Interventional Cardiology

## 2018-02-05 NOTE — Telephone Encounter (Signed)
Left message for patient to call back and discuss information below.

## 2018-02-05 NOTE — Telephone Encounter (Addendum)
Patient returning call. Patient states that she has not had any issues with bleeding while being on the ASA. She denies having any S/Sx of bleeding such as blood in her urine or stool, or nosebleeds, etc. Made patient aware that since she has high cholesterol and is borderline diabetic that these due increase her risk for cardiac event. Advised her to continue taking the ASA since she was not having any bleeding issues. Instructed the patient to let us know if that changed and we could re-evaluate. Patient was in agreement with this plan and thanked me for the call.

## 2018-02-19 DIAGNOSIS — N183 Chronic kidney disease, stage 3 (moderate): Secondary | ICD-10-CM | POA: Diagnosis not present

## 2018-02-23 DIAGNOSIS — M8588 Other specified disorders of bone density and structure, other site: Secondary | ICD-10-CM | POA: Diagnosis not present

## 2018-07-20 ENCOUNTER — Other Ambulatory Visit: Payer: Self-pay

## 2018-07-20 ENCOUNTER — Emergency Department (HOSPITAL_COMMUNITY): Payer: Medicare Other

## 2018-07-20 ENCOUNTER — Emergency Department (HOSPITAL_COMMUNITY)
Admission: EM | Admit: 2018-07-20 | Discharge: 2018-07-20 | Disposition: A | Discharge status: Home / Self Care | Payer: Medicare Other | Attending: Emergency Medicine | Admitting: Emergency Medicine

## 2018-07-20 ENCOUNTER — Encounter (HOSPITAL_COMMUNITY): Payer: Self-pay

## 2018-07-20 DIAGNOSIS — Y939 Activity, unspecified: Secondary | ICD-10-CM | POA: Diagnosis not present

## 2018-07-20 DIAGNOSIS — R0789 Other chest pain: Secondary | ICD-10-CM | POA: Diagnosis present

## 2018-07-20 DIAGNOSIS — S2242XA Multiple fractures of ribs, left side, initial encounter for closed fracture: Secondary | ICD-10-CM | POA: Insufficient documentation

## 2018-07-20 DIAGNOSIS — Y999 Unspecified external cause status: Secondary | ICD-10-CM | POA: Insufficient documentation

## 2018-07-20 DIAGNOSIS — E039 Hypothyroidism, unspecified: Secondary | ICD-10-CM | POA: Diagnosis not present

## 2018-07-20 DIAGNOSIS — Z79899 Other long term (current) drug therapy: Secondary | ICD-10-CM | POA: Diagnosis not present

## 2018-07-20 DIAGNOSIS — Y92814 Boat as the place of occurrence of the external cause: Secondary | ICD-10-CM | POA: Insufficient documentation

## 2018-07-20 DIAGNOSIS — W1830XA Fall on same level, unspecified, initial encounter: Secondary | ICD-10-CM | POA: Insufficient documentation

## 2018-07-20 DIAGNOSIS — N183 Chronic kidney disease, stage 3 (moderate): Secondary | ICD-10-CM | POA: Insufficient documentation

## 2018-07-20 MED ORDER — IBUPROFEN 600 MG PO TABS: 600.0000 mg | ORAL_TABLET | Freq: Three times a day (TID) | ORAL | 0 refills | Status: DC | PRN

## 2018-07-20 MED ORDER — OXYCODONE-ACETAMINOPHEN 5-325 MG PO TABS: 1.0000 | ORAL_TABLET | ORAL | 0 refills | Status: DC | PRN

## 2018-07-20 MED ORDER — IBUPROFEN 200 MG PO TABS
600.0000 mg | ORAL_TABLET | Freq: Once | ORAL | Status: AC
  Administered 2018-07-20: 600 mg via ORAL
  Filled 2018-07-20: qty 3

## 2018-07-20 MED ORDER — OXYCODONE-ACETAMINOPHEN 5-325 MG PO TABS
1.0000 | ORAL_TABLET | Freq: Once | ORAL | Status: AC
  Administered 2018-07-20: 1 via ORAL
  Filled 2018-07-20: qty 1

## 2018-07-20 NOTE — ED Provider Notes (Signed)
[facility] DEPT Provider Note   CSN: [id number] Arrival date & time: 07/20/18  7619     History   Chief Complaint No chief complaint on file.   HPI Kathy [name] is a 67 y.o. female.  HPI 67 year old female presents emergency department after a fall 1 week ago.  She injured her left lateral chest when falling on a moving boat striking her left side of her chest against the side of the boat.  Initially her pain was improving but then she sneezed 3 days ago and began having severe left lateral chest pain again.  Denies productive cough.  No shortness of breath.  No fevers or chills.  Pain is moderate to severe in severity worse with palpation.  She did note bruising to the left chest   Past Medical History:  Diagnosis Date  . CKD (chronic kidney disease), stage III ([place])   . Rosacea   . Thyroid disease     Patient Active Problem List   Diagnosis Date Noted  . Elevated LFTs 09/06/2015  . Hypothyroidism 04/29/2012  . Hypercholesteremia 04/29/2012  . Osteopenia     Past Surgical History:  Procedure Laterality Date  . BTL    . PILONIDAL CYST / SINUS EXCISION    . THYROID SURGERY  2008   partial thyroidectomy     OB History    Gravida  2   Para  2   Term      Preterm      AB  0   Living  2     SAB      TAB      Ectopic      Multiple      Live Births               Home Medications    Prior to Admission medications   Medication Sig Start Date End Date Taking? Authorizing Provider  acetaminophen (TYLENOL) 500 MG tablet Take 1,000 mg by mouth every 6 (six) hours as needed for mild pain.   Yes [provider]  ALPRAZolam (XANAX) 0.25 MG tablet Take 0.25 mg by mouth every 4 (four) hours as needed for anxiety.   Yes [provider]  aspirin EC 81 MG tablet Take 81 mg by mouth daily.   Yes [provider]  Biotin 1000 MCG tablet Take 1,000 mcg by mouth daily.    Yes [provider]    Calcium 200 MG TABS Take 200 mg by mouth daily.   Yes [provider]  Coenzyme Q10 (CO Q 10) 100 MG CAPS Take 100 mg by mouth daily.    Yes [provider]  levothyroxine (SYNTHROID, LEVOTHROID) 88 MCG tablet Take 88 mcg by mouth daily.   Yes [provider]  Multiple Vitamins-Minerals (MULTIVITAMINS THER. W/MINERALS) TABS Take 1 tablet by mouth daily.   Yes [provider]  omega-3 acid ethyl esters (LOVAZA) 1 g capsule TAKE 2 CAPSULES BY MOUTH 2 TIMES DAILY. Patient taking differently: Take 2 g by mouth 2 (two) times daily. TAKE 2 CAPSULES BY MOUTH 2 TIMES DAILY. 12/11/17  Yes Jettie Booze, MD  rosuvastatin (CRESTOR) 10 MG tablet Take 1 tablet (10 mg total) by mouth daily. 12/11/17  Yes Jettie Booze, MD  tretinoin (RETIN-A) 0.025 % cream Apply 1 application topically at bedtime.    Yes [provider]  Vitamin D, Cholecalciferol, 1000 units CAPS Take 1,000 Units by mouth daily.   Yes [provider]  vitamin E 100 UNIT capsule Take 100 Units by mouth daily.   Yes [provider]  ibuprofen (ADVIL,MOTRIN) 600 MG tablet Take 1 tablet (600 mg total) by mouth every 8 (eight) hours as needed. 07/20/18   Jola Schmidt, MD  oxyCODONE-acetaminophen (PERCOCET/ROXICET) 5-325 MG tablet Take 1 tablet by mouth every 4 (four) hours as needed for severe pain. 07/20/18   Jola Schmidt, MD    Family History Family History  Problem Relation Age of Onset  . Hypercholesterolemia Mother   . Diabetes Mellitus I Mother   . Prostate cancer Father   . Hypercholesterolemia Father   . AAA (abdominal aortic aneurysm) Father   . Stroke Other   . Heart failure Other     Social History Social History   Tobacco Use  . Smoking status: Never Smoker  . Smokeless tobacco: Never Used  Substance Use Topics  . Alcohol use: Yes    Alcohol/week: 0.0 standard drinks  . Drug use: No     Allergies   Lipitor [atorvastatin]; Zetia [ezetimibe];  and Zocor [simvastatin]   Review of Systems Review of Systems  All other systems reviewed and are negative.    Physical Exam Updated Vital Signs Ht 5\' 5"  (1.651 m)   Wt 74.4 kg   BMI 27.29 kg/m   Physical Exam  Constitutional: She is oriented to person, place, and time. She appears well-developed and well-nourished.  HENT:  Head: Normocephalic.  Eyes: EOM are normal.  Neck: Normal range of motion.  Cardiovascular: Normal rate and regular rhythm.  Pulmonary/Chest: Effort normal and breath sounds normal.  Bruising of the left lateral chest noted without crepitus or paradoxical movement  Abdominal: She exhibits no distension.  Musculoskeletal: Normal range of motion.  Neurological: She is alert and oriented to person, place, and time.  Psychiatric: She has a normal mood and affect.  Nursing note and vitals reviewed.    ED Treatments / Results  Labs (all labs ordered are listed, but only abnormal results are displayed) Labs Reviewed - No data to display  EKG None  Radiology Dg Ribs Unilateral W/chest Left  Result Date: 07/20/2018 CLINICAL DATA:  Anterior left rib pain post fall. EXAM: LEFT RIBS AND CHEST - 3+ VIEW COMPARISON:  None. FINDINGS: Several left lateral rib fractures, mainly 6-10 ribs, minimally displaced. No evidence of pneumothorax. Soft tissues are grossly normal. IMPRESSION: Minimally displaced 6-10 left lateral rib fractures. No evidence of pneumothorax. Electronically Signed   By: Fidela Salisbury M.D.   On: 07/20/2018 09:45    Procedures Procedures (including critical care time)  Medications Ordered in ED Medications  oxyCODONE-acetaminophen (PERCOCET/ROXICET) 5-325 MG per tablet 1 tablet (1 tablet Oral Given 07/20/18 0939)  ibuprofen (ADVIL,MOTRIN) tablet 600 mg (600 mg Oral Given 07/20/18 0939)     Initial Impression / Assessment and Plan / ED Course  I have reviewed the triage vital signs and the nursing notes.  Pertinent labs & imaging  results that were available during my care of the patient were reviewed by me and considered in my medical decision making (see chart for details).     Left lateral rib fractures 6 through 10.  Minimally displaced.  No underlying pneumothorax.  Home with pain control and incentive spirometry.  Instructions to return to the ER for new or worsening symptoms  Final Clinical Impressions(s) / ED Diagnoses   Final diagnoses:  Closed fracture of multiple ribs of left side, initial encounter    ED Discharge Orders  Ordered    oxyCODONE-acetaminophen (PERCOCET/ROXICET) 5-325 MG tablet  Every 4 hours PRN     07/20/18 0955    ibuprofen (ADVIL,MOTRIN) 600 MG tablet  Every 8 hours PRN     07/20/18 0955           Jola Schmidt, MD 07/20/18 [phone]

## 2018-07-20 NOTE — ED Notes (Signed)
ED Provider at bedside. 

## 2018-07-20 NOTE — ED Notes (Signed)
Patient transported to X-ray 

## 2018-07-20 NOTE — ED Triage Notes (Signed)
1wk ago pt tripped into a pole. Pt stated she sneezed on Friday and heard a pop in her left rib cage. Unsure of what all injuries she sustained in initial fall. Bruising noted to left foot.

## 2018-10-23 ENCOUNTER — Other Ambulatory Visit: Payer: Self-pay | Admitting: Women's Health

## 2018-10-23 DIAGNOSIS — Z1231 Encounter for screening mammogram for malignant neoplasm of breast: Secondary | ICD-10-CM

## 2018-10-28 ENCOUNTER — Telehealth: Payer: Self-pay | Admitting: Interventional Cardiology

## 2018-10-28 DIAGNOSIS — E7800 Pure hypercholesterolemia, unspecified: Secondary | ICD-10-CM

## 2018-10-28 NOTE — Telephone Encounter (Signed)
New Message   Pt states she normally has labs at her yearly f/u but no orders are in epic. Appt scheduled for 01/21/19 at 3:00 pm

## 2018-10-28 NOTE — Telephone Encounter (Signed)
Lab orders placed. Left message for patient to call back and schedule FASTING lab appointment.

## 2018-10-29 NOTE — Telephone Encounter (Signed)
Will send letter in the mail.

## 2018-12-16 ENCOUNTER — Ambulatory Visit
Admission: RE | Admit: 2018-12-16 | Discharge: 2018-12-16 | Disposition: A | Discharge status: Home / Self Care | Payer: 59 | Source: Ambulatory Visit | Attending: Women's Health | Admitting: Women's Health

## 2018-12-16 DIAGNOSIS — Z1231 Encounter for screening mammogram for malignant neoplasm of breast: Secondary | ICD-10-CM | POA: Diagnosis not present

## 2018-12-21 ENCOUNTER — Encounter: Payer: Medicare Other | Admitting: Women's Health

## 2018-12-30 ENCOUNTER — Other Ambulatory Visit: Payer: Self-pay | Admitting: Interventional Cardiology

## 2018-12-31 DIAGNOSIS — L820 Inflamed seborrheic keratosis: Secondary | ICD-10-CM | POA: Diagnosis not present

## 2018-12-31 DIAGNOSIS — L821 Other seborrheic keratosis: Secondary | ICD-10-CM | POA: Diagnosis not present

## 2018-12-31 DIAGNOSIS — B351 Tinea unguium: Secondary | ICD-10-CM | POA: Diagnosis not present

## 2018-12-31 DIAGNOSIS — L814 Other melanin hyperpigmentation: Secondary | ICD-10-CM | POA: Diagnosis not present

## 2018-12-31 DIAGNOSIS — L570 Actinic keratosis: Secondary | ICD-10-CM | POA: Diagnosis not present

## 2018-12-31 DIAGNOSIS — D225 Melanocytic nevi of trunk: Secondary | ICD-10-CM | POA: Diagnosis not present

## 2019-01-21 ENCOUNTER — Ambulatory Visit: Payer: Medicare Other | Admitting: Interventional Cardiology

## 2019-01-25 ENCOUNTER — Other Ambulatory Visit: Payer: 59 | Admitting: *Deleted

## 2019-01-25 ENCOUNTER — Other Ambulatory Visit: Payer: Self-pay

## 2019-01-25 DIAGNOSIS — E7800 Pure hypercholesterolemia, unspecified: Secondary | ICD-10-CM

## 2019-01-25 LAB — COMPREHENSIVE METABOLIC PANEL
ALBUMIN: 4.5 g/dL (ref 3.8–4.8)
ALT: 25 IU/L (ref 0–32)
AST: 40 IU/L (ref 0–40)
Albumin/Globulin Ratio: 2 (ref 1.2–2.2)
Alkaline Phosphatase: 77 IU/L (ref 39–117)
BILIRUBIN TOTAL: 0.5 mg/dL (ref 0.0–1.2)
BUN / CREAT RATIO: 10 — AB (ref 12–28)
BUN: 9 mg/dL (ref 8–27)
CALCIUM: 9.3 mg/dL (ref 8.7–10.3)
CHLORIDE: 101 mmol/L (ref 96–106)
CO2: 24 mmol/L (ref 20–29)
Creatinine, Ser: 0.94 mg/dL (ref 0.57–1.00)
GFR calc Af Amer: 72 mL/min/{1.73_m2} (ref >59)
GFR calc non Af Amer: 62 mL/min/{1.73_m2} (ref >59)
Globulin, Total: 2.2 g/dL (ref 1.5–4.5)
Glucose: 109 mg/dL — ABNORMAL HIGH (ref 65–99)
Potassium: 4.6 mmol/L (ref 3.5–5.2)
Sodium: 140 mmol/L (ref 134–144)
Total Protein: 6.7 g/dL (ref 6.0–8.5)

## 2019-01-25 LAB — LIPID PANEL
Chol/HDL Ratio: 2.7 ratio (ref 0.0–4.4)
Cholesterol, Total: 198 mg/dL (ref 100–199)
HDL: 73 mg/dL (ref >39)
LDL CALC: 101 mg/dL — AB (ref 0–99)
Triglycerides: 119 mg/dL (ref 0–149)
VLDL CHOLESTEROL CAL: 24 mg/dL (ref 5–40)

## 2019-01-28 ENCOUNTER — Telehealth: Payer: Self-pay

## 2019-01-28 MED ORDER — OMEGA-3-ACID ETHYL ESTERS 1 G PO CAPS
ORAL_CAPSULE | ORAL | 1 refills | Status: DC
Start: 2019-01-28 — End: 2019-02-23

## 2019-01-28 MED ORDER — ROSUVASTATIN CALCIUM 10 MG PO TABS: 10.0000 mg | ORAL_TABLET | Freq: Every day | ORAL | 1 refills | Status: DC

## 2019-01-28 NOTE — Telephone Encounter (Signed)
Appointment Cancelled due to Coronavirus:  Called patient in regards to 1 yr f/u appointment with Dr. Irish Lack on 02/01/19.   Patient denies having any chest pain, SOB, increasing edema, wt gain, or increase in abdominal girth, syncope, or any other Sx.  Patient was okay with cancelling appointment and has been made aware that they will be contacted in the near future to reschedule.   Patient understands to let us know if they develop any Sx before then.  Refills have been sent in.   Visitor policy has been reviewed with the patient.   Will route to "CV DIV C19 CANCEL" pool.

## 2019-01-28 NOTE — Telephone Encounter (Signed)
Left message for patient to call back to discuss appointment on 3/23 with Dr. Irish Lack.

## 2019-02-01 ENCOUNTER — Ambulatory Visit: Payer: Medicare Other | Admitting: Interventional Cardiology

## 2019-02-17 DIAGNOSIS — J069 Acute upper respiratory infection, unspecified: Secondary | ICD-10-CM | POA: Diagnosis not present

## 2019-02-17 NOTE — Telephone Encounter (Signed)
Virtual Visit Pre-Appointment Phone Call   TELEPHONE CALL NOTE  Kathy [name] has been deemed a candidate for a follow-up tele-health visit to limit community exposure during the Covid-19 pandemic. I spoke with the patient via phone to ensure availability of phone/video source, confirm preferred email & phone number, and discuss instructions and expectations.  I reminded Kathy [name] to be prepared with any vital sign and/or heart rhythm information that could potentially be obtained via home monitoring, at the time of her visit. I reminded Kathy [name] to expect a phone call at the time of her visit if her visit.  Did the patient verbally acknowledge consent to treatment? YES  Patient does not have smartphone. Patient agrees to TELEPHONE Visit with Kathy Barrios, PA on 4/14  Kathy [name], [place] 02/17/2019 11:20 AM   CONSENT FOR TELE-HEALTH VISIT - PLEASE REVIEW  I hereby voluntarily request, consent and authorize [facility] and its employed or contracted physicians, physician assistants, nurse practitioners or other licensed health care professionals (the Practitioner), to provide me with telemedicine health care services (the "Services") as deemed necessary by the treating Practitioner. I acknowledge and consent to receive the Services by the Practitioner via telemedicine. I understand that the telemedicine visit will involve communicating with the Practitioner through live audiovisual communication technology and the disclosure of certain medical information by electronic transmission. I acknowledge that I have been given the opportunity to request an in-person assessment or other available alternative prior to the telemedicine visit and am voluntarily participating in the telemedicine visit.  I understand that I have the right to withhold or withdraw my consent to the use of telemedicine in the course of my care at any time, without affecting my right to future care or  treatment, and that the Practitioner or I may terminate the telemedicine visit at any time. I understand that I have the right to inspect all information obtained and/or recorded in the course of the telemedicine visit and may receive copies of available information for a reasonable fee.  I understand that some of the potential risks of receiving the Services via telemedicine include:  Kathy [name] Delay or interruption in medical evaluation due to technological equipment failure or disruption; . Information transmitted may not be sufficient (e.g. poor resolution of images) to allow for appropriate medical decision making by the Practitioner; and/or  . In rare instances, security protocols could fail, causing a breach of personal health information.  Furthermore, I acknowledge that it is my responsibility to provide information about my medical history, conditions and care that is complete and accurate to the best of my ability. I acknowledge that Practitioner's advice, recommendations, and/or decision may be based on factors not within their control, such as incomplete or inaccurate data provided by me or distortions of diagnostic images or specimens that may result from electronic transmissions. I understand that the practice of medicine is not an exact science and that Practitioner makes no warranties or guarantees regarding treatment outcomes. I acknowledge that I will receive a copy of this consent concurrently upon execution via email to the email address I last provided but may also request a printed copy by calling the office of [facility].    I understand that my insurance will be billed for this visit.   I have read or had this consent read to me. . I understand the contents of this consent, which adequately explains the benefits and risks of the Services being provided via telemedicine.  Kathy [name]  I have been provided ample opportunity to ask questions regarding this consent and the Services and have had my  questions answered to my satisfaction. . I give my informed consent for the services to be provided through the use of telemedicine in my medical care  By participating in this telemedicine visit I agree to the above.

## 2019-02-22 DIAGNOSIS — R7303 Prediabetes: Secondary | ICD-10-CM | POA: Insufficient documentation

## 2019-02-22 NOTE — Progress Notes (Signed)
Virtual Visit via Telephone Note   This visit type was conducted due to national recommendations for restrictions regarding the COVID-19 Pandemic (e.g. social distancing) in an effort to limit this patient's exposure and mitigate transmission in our community.  Due to her co-morbid illnesses, this patient is at least at moderate risk for complications without adequate follow up.  This format is felt to be most appropriate for this patient at this time.  The patient did not have access to video technology/had technical difficulties with video requiring transitioning to audio format only (telephone).  All issues noted in this document were discussed and addressed.  No physical exam could be performed with this format.  Please refer to the patient's chart for her  consent to telehealth for [facility].   Evaluation Performed:  Follow-up visit  Date:  02/23/2019   ID:  Kathy [name], DOB Aug 01, 1951, MRN [id number]  Patient Location: Home  Provider Location: Home  PCP:  Shirline Frees, MD  Cardiologist:  Larae Grooms, MD  Electrophysiologist:  None   Chief Complaint:  Follow up  History of Present Illness:    Kathy [name] is a 68 y.o. female who presents via audio/video conferencing for a telehealth visit today.    Patient with history of Hyperlipidemia on Lovaza and Crestor 10 mg -nausea with 20 mg and intolerant to Zetia, lipitor, zocor. Managed by the lipid clinic. Has also had elevated LFT's, borderline DM. Last saw Dr. Irish Lack 11/2017 and doing well.  Patient says she's doing very well. Denies chest pain, dyspnea, dyspnea on exertion, dizziness, edema or presyncope. Trouble with allergies. Was walking 2 miles in 30 min unless her allergies are bothering her.    The patient does not have symptoms concerning for COVID-19 infection (fever, chills, cough, or new shortness of breath).    Past Medical History:  Diagnosis Date  . CKD (chronic kidney disease), stage III  ([place])   . Rosacea   . Thyroid disease    Past Surgical History:  Procedure Laterality Date  . BTL    . PILONIDAL CYST / SINUS EXCISION    . THYROID SURGERY  2008   partial thyroidectomy     Current Meds  Medication Sig  . acetaminophen (TYLENOL) 500 MG tablet Take 1,000 mg by mouth every 6 (six) hours as needed for mild pain.  [name] ALPRAZolam (XANAX) 0.25 MG tablet Take 0.25 mg by mouth every 4 (four) hours as needed for anxiety.  [name] aspirin EC 81 MG tablet Take 81 mg by mouth daily.  . Biotin 1000 MCG tablet Take 1,000 mcg by mouth daily.   . Calcium 200 MG TABS Take 200 mg by mouth daily.  . Coenzyme Q10 (CO Q 10) 100 MG CAPS Take 100 mg by mouth daily.   [name] ibuprofen (ADVIL,MOTRIN) 600 MG tablet Take 1 tablet (600 mg total) by mouth every 8 (eight) hours as needed.  [name] levothyroxine (SYNTHROID, LEVOTHROID) 88 MCG tablet Take 88 mcg by mouth daily.  . Multiple Vitamins-Minerals (MULTIVITAMINS THER. W/MINERALS) TABS Take 1 tablet by mouth daily.  [name] tretinoin (RETIN-A) 0.025 % cream Apply 1 application topically at bedtime.   . Vitamin D, Cholecalciferol, 1000 units CAPS Take 1,000 Units by mouth daily.  . [DISCONTINUED] omega-3 acid ethyl esters (LOVAZA) 1 g capsule TAKE 2 CAPSULES BY MOUTH 2 TIMES DAILY.  . [DISCONTINUED] rosuvastatin (CRESTOR) 10 MG tablet Take 1 tablet (10 mg total) by mouth daily.     Allergies:   Lipitor [atorvastatin];  Zetia [ezetimibe]; and Zocor [simvastatin]   Social History   Tobacco Use  . Smoking status: Never Smoker  . Smokeless tobacco: Never Used  Substance Use Topics  . Alcohol use: Yes    Alcohol/week: 0.0 standard drinks  . Drug use: No     Family Hx: The patient's family history includes AAA (abdominal aortic aneurysm) in her father; Diabetes Mellitus I in her mother; Heart failure in an other family member; Hypercholesterolemia in her father and mother; Prostate cancer in her father; Stroke in an other family member.  ROS:   Please see the  history of present illness.    Review of Systems  Constitution: Negative.  HENT: Negative.        Seasonal allergies  Eyes: Negative.   Cardiovascular: Negative.   Respiratory: Negative.   Hematologic/Lymphatic: Negative.   Musculoskeletal: Negative.  Negative for joint pain.  Gastrointestinal: Negative.   Genitourinary: Negative.   Neurological: Negative.     All other systems reviewed and are negative.   Prior CV studies:   The following studies were reviewed today:  Lipids 01/25/19 Chol 198, HDL 73, LDL 101, trig 119 CMET stable  Labs/Other Tests and Data Reviewed:    EKG:  No ECG reviewed.  Recent Labs: 01/25/2019: ALT 25; BUN 9; Creatinine, Ser 0.94; Potassium 4.6; Sodium 140   Recent Lipid Panel Lab Results  Component Value Date/Time   CHOL 198 01/25/2019 10:16 AM   CHOL 216 (H) 09/03/2016 07:54 AM   CHOL 214 (H) 03/08/2015 07:55 AM   TRIG 119 01/25/2019 10:16 AM   TRIG 181 (H) 09/03/2016 07:54 AM   TRIG 148 03/08/2015 07:55 AM   HDL 73 01/25/2019 10:16 AM   HDL 66 09/03/2016 07:54 AM   HDL 70 03/08/2015 07:55 AM   CHOLHDL 2.7 01/25/2019 10:16 AM   CHOLHDL 3.3 09/03/2016 07:54 AM   LDLCALC 101 (H) 01/25/2019 10:16 AM   LDLCALC 120 (H) 09/03/2016 07:54 AM   LDLCALC 114 (H) 03/08/2015 07:55 AM    Wt Readings from Last 3 Encounters:  02/23/19 166 lb (75.3 kg)  07/20/18 164 lb (74.4 kg)  12/16/17 171 lb (77.6 kg)     Objective:    Vital Signs:  BP (!) 142/80   Ht 5\' 5"  (1.651 m)   Wt 166 lb (75.3 kg)   BMI 27.62 kg/m  repeat BP 129/70    ASSESSMENT & PLAN:    1. Hyperlipidemia intolerant to statins on crestor 10 mg and lovaza-labs 01/2019 triglycerides improved but LDL still above goal. 2. Elevated LFT's-recent labs normal 3. Borderline DM-glucose 109 due for Hgb A1C by PCP  COVID-19 Education: The signs and symptoms of COVID-19 were discussed with the patient and how to seek care for testing (follow up with PCP or arrange E-visit).  The  importance of social distancing was discussed today.  Time:   Today, I have spent 11;46 minutes with the patient with telehealth technology discussing the above problems.     Medication Adjustments/Labs and Tests Ordered: Current medicines are reviewed at length with the patient today.  Concerns regarding medicines are outlined above.  Tests Ordered: No orders of the defined types were placed in this encounter.  Medication Changes: No orders of the defined types were placed in this encounter.   Disposition:  Follow up in 1 year(s) Dr. Irish Lack  Signed, [name], PA-C  02/23/2019 10:08 AM    [place]

## 2019-02-23 ENCOUNTER — Other Ambulatory Visit: Payer: Self-pay

## 2019-02-23 ENCOUNTER — Encounter: Payer: Self-pay | Admitting: Physician Assistant

## 2019-02-23 ENCOUNTER — Other Ambulatory Visit: Payer: Self-pay | Admitting: Interventional Cardiology

## 2019-02-23 ENCOUNTER — Telehealth (INDEPENDENT_AMBULATORY_CARE_PROVIDER_SITE_OTHER): Payer: Medicare Other | Admitting: Physician Assistant

## 2019-02-23 VITALS — BP 142/80 | Ht 65.0 in | Wt 166.0 lb

## 2019-02-23 DIAGNOSIS — R7989 Other specified abnormal findings of blood chemistry: Secondary | ICD-10-CM

## 2019-02-23 DIAGNOSIS — E7800 Pure hypercholesterolemia, unspecified: Secondary | ICD-10-CM | POA: Diagnosis not present

## 2019-02-23 DIAGNOSIS — R945 Abnormal results of liver function studies: Secondary | ICD-10-CM

## 2019-02-23 DIAGNOSIS — R7303 Prediabetes: Secondary | ICD-10-CM

## 2019-02-23 MED ORDER — ROSUVASTATIN CALCIUM 10 MG PO TABS: 10.0000 mg | ORAL_TABLET | Freq: Every day | ORAL | 3 refills | Status: DC

## 2019-02-23 MED ORDER — ASPIRIN EC 81 MG PO TBEC: 81.0000 mg | DELAYED_RELEASE_TABLET | Freq: Every day | ORAL | 3 refills | Status: AC

## 2019-02-23 MED ORDER — OMEGA-3-ACID ETHYL ESTERS 1 G PO CAPS
ORAL_CAPSULE | ORAL | 3 refills | Status: DC
Start: 2019-02-23 — End: 2020-02-08

## 2019-02-23 NOTE — Telephone Encounter (Signed)
Pt's medications were resent to a mail order pharmacy. Confirmation received.

## 2019-02-23 NOTE — Patient Instructions (Signed)

## 2019-04-13 DIAGNOSIS — Z0000 Encounter for general adult medical examination without abnormal findings: Secondary | ICD-10-CM | POA: Diagnosis not present

## 2019-04-13 DIAGNOSIS — E7800 Pure hypercholesterolemia, unspecified: Secondary | ICD-10-CM | POA: Diagnosis not present

## 2019-04-13 DIAGNOSIS — E559 Vitamin D deficiency, unspecified: Secondary | ICD-10-CM | POA: Diagnosis not present

## 2019-04-13 DIAGNOSIS — R7303 Prediabetes: Secondary | ICD-10-CM | POA: Diagnosis not present

## 2019-04-13 DIAGNOSIS — F411 Generalized anxiety disorder: Secondary | ICD-10-CM | POA: Diagnosis not present

## 2019-04-13 DIAGNOSIS — E039 Hypothyroidism, unspecified: Secondary | ICD-10-CM | POA: Diagnosis not present

## 2019-04-19 ENCOUNTER — Ambulatory Visit: Payer: Medicare Other | Admitting: Interventional Cardiology

## 2019-06-22 DIAGNOSIS — L821 Other seborrheic keratosis: Secondary | ICD-10-CM | POA: Diagnosis not present

## 2019-06-22 DIAGNOSIS — L814 Other melanin hyperpigmentation: Secondary | ICD-10-CM | POA: Diagnosis not present

## 2019-06-22 DIAGNOSIS — L819 Disorder of pigmentation, unspecified: Secondary | ICD-10-CM | POA: Diagnosis not present

## 2019-06-22 DIAGNOSIS — L570 Actinic keratosis: Secondary | ICD-10-CM | POA: Diagnosis not present

## 2019-06-29 DIAGNOSIS — E039 Hypothyroidism, unspecified: Secondary | ICD-10-CM | POA: Diagnosis not present

## 2019-06-29 DIAGNOSIS — E7800 Pure hypercholesterolemia, unspecified: Secondary | ICD-10-CM | POA: Diagnosis not present

## 2019-06-29 DIAGNOSIS — R7303 Prediabetes: Secondary | ICD-10-CM | POA: Diagnosis not present

## 2019-06-29 DIAGNOSIS — E559 Vitamin D deficiency, unspecified: Secondary | ICD-10-CM | POA: Diagnosis not present

## 2019-08-25 DIAGNOSIS — D485 Neoplasm of uncertain behavior of skin: Secondary | ICD-10-CM | POA: Diagnosis not present

## 2019-08-25 DIAGNOSIS — L570 Actinic keratosis: Secondary | ICD-10-CM | POA: Diagnosis not present

## 2019-08-26 IMAGING — CR DG RIBS W/ CHEST 3+V*L*
4 series · 4 of 4 positions shown · non-contrast
Comparison: None.

CLINICAL DATA: Anterior left rib pain post fall.

EXAM:
LEFT RIBS AND CHEST - 3+ VIEW

[w chest pa]
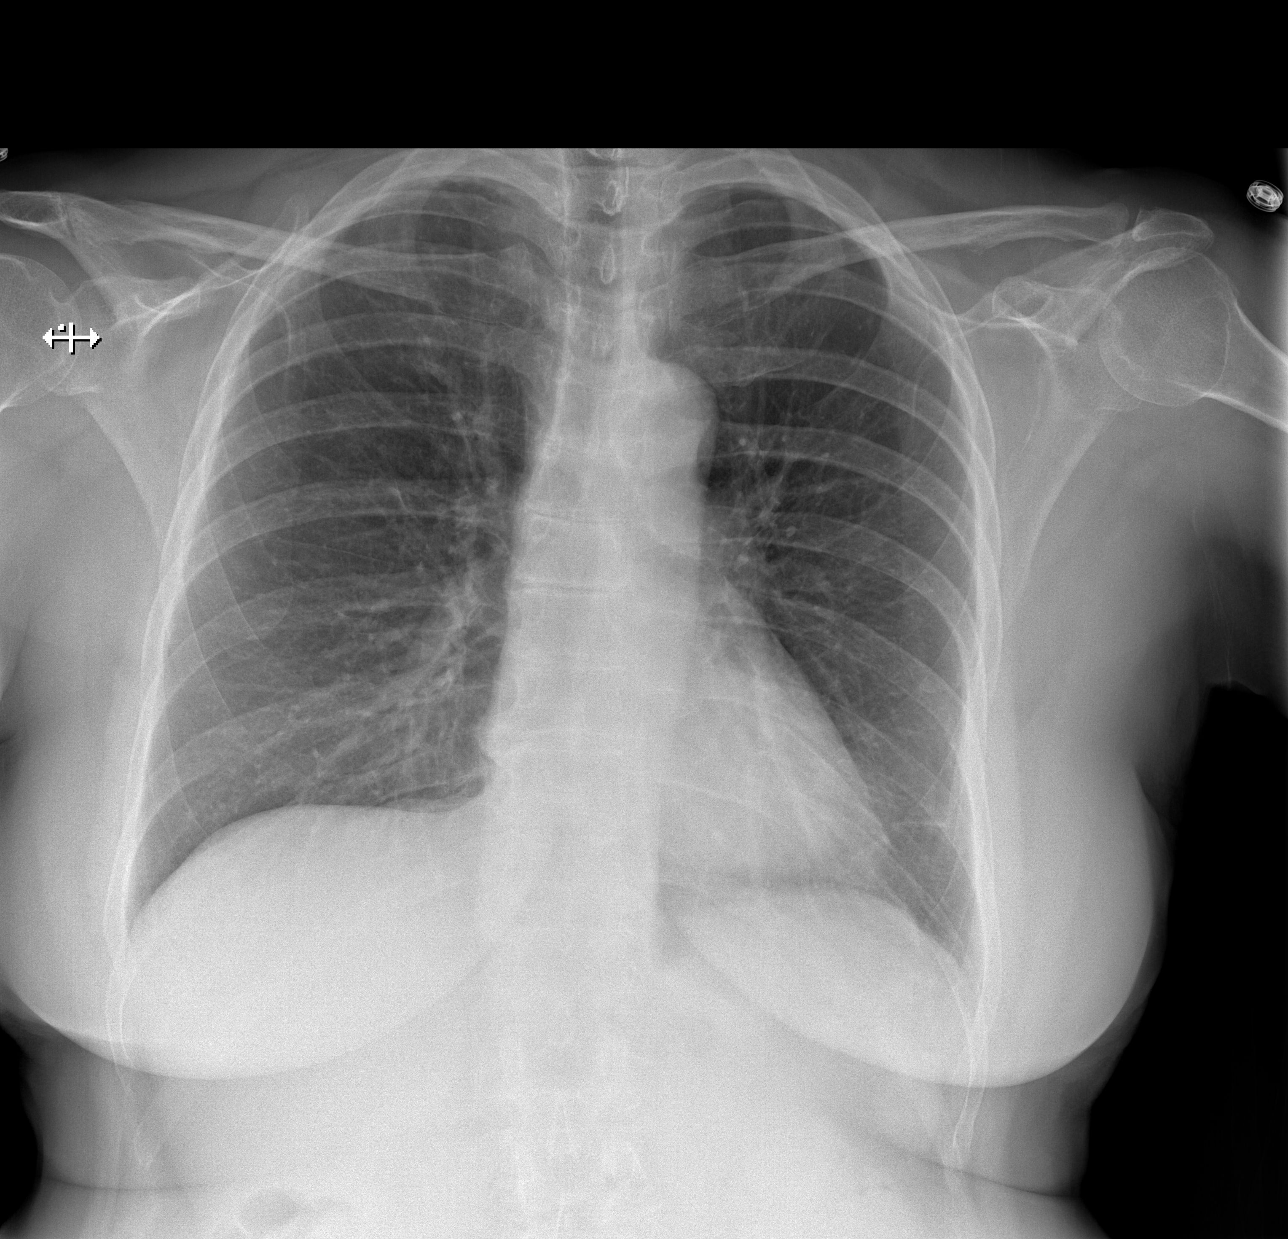

[w ribs ap upper left]
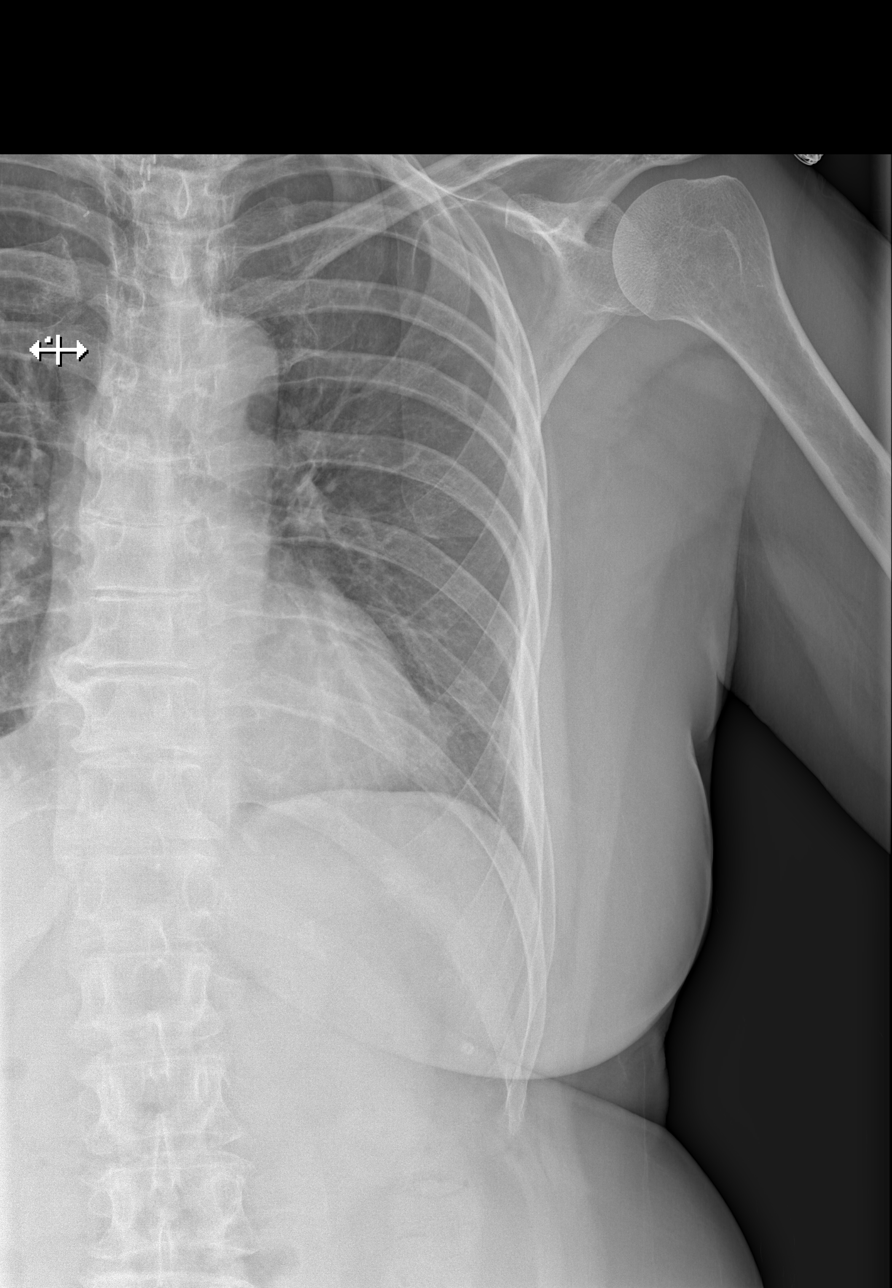

[w ribs ap lower left]
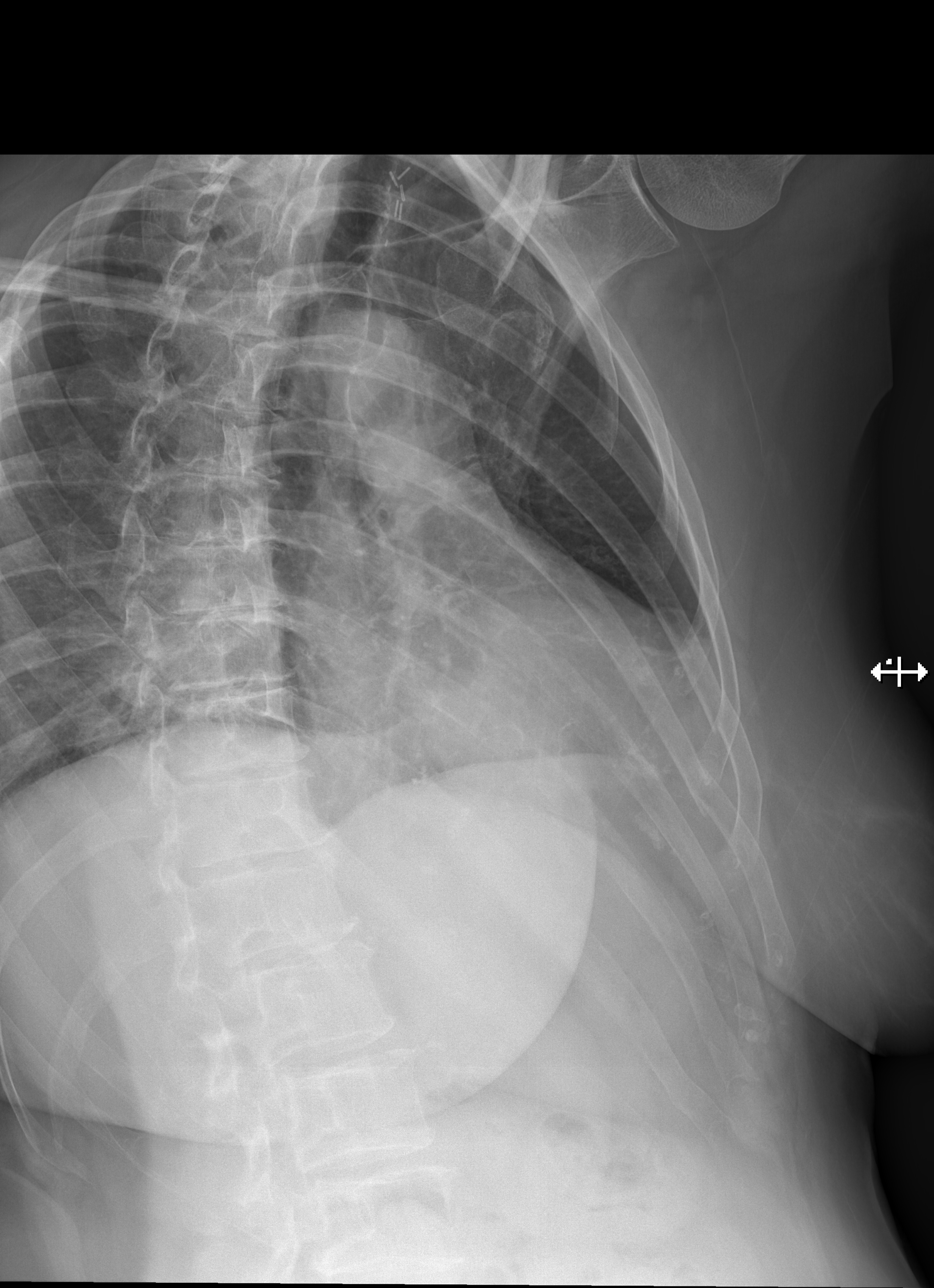

[w ribs obl left]
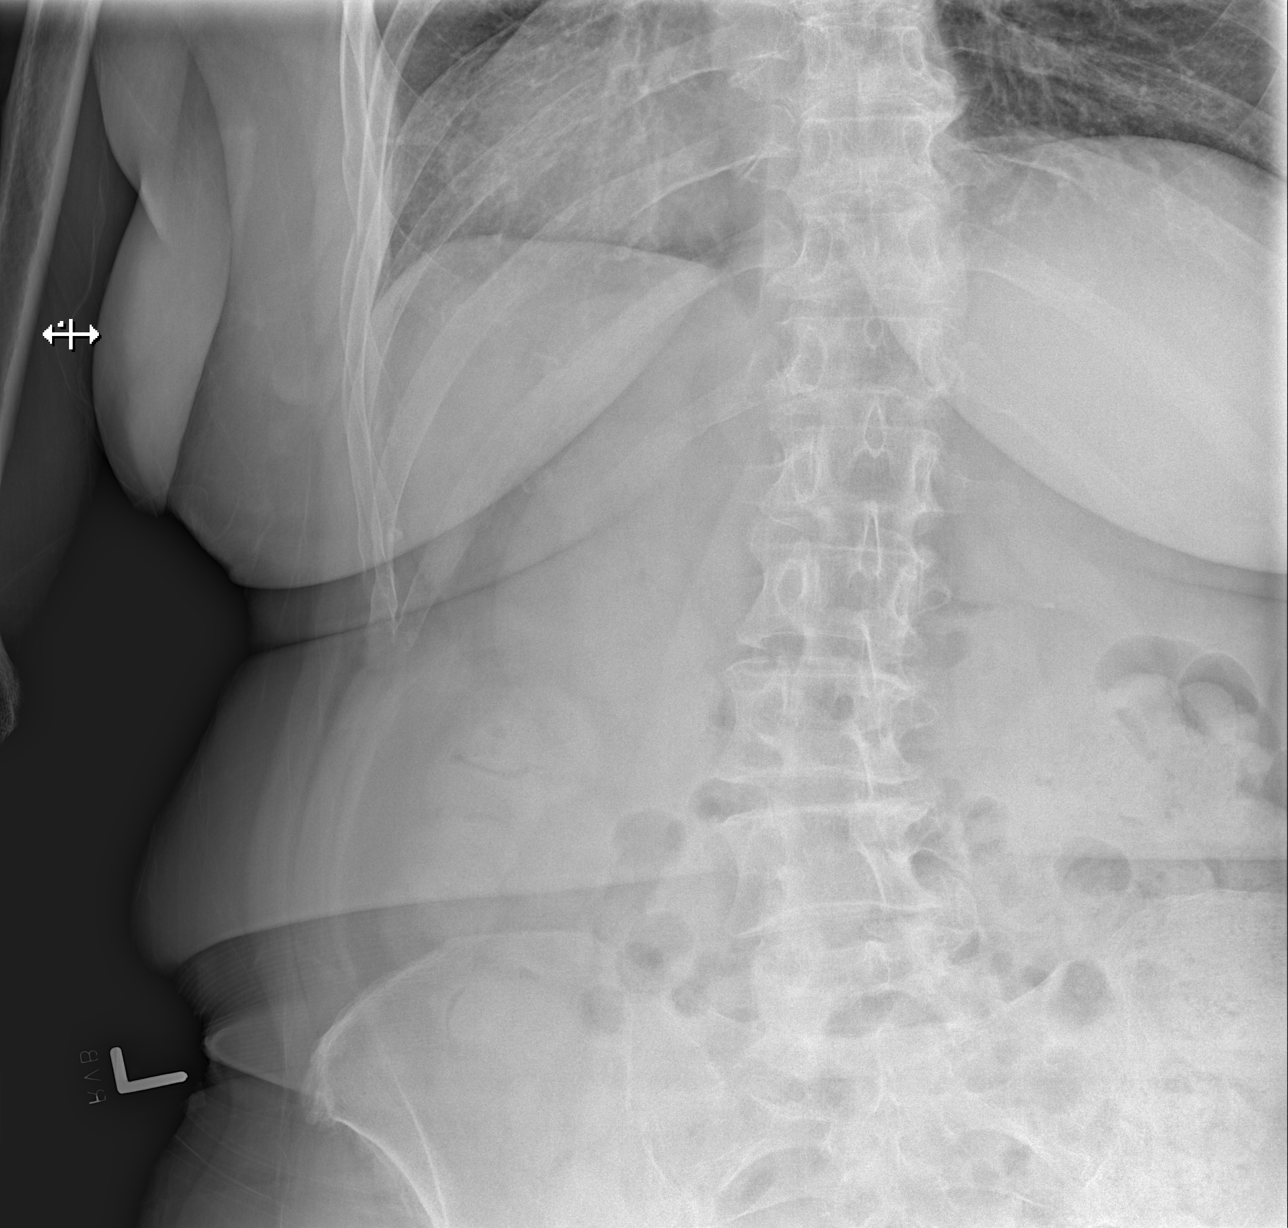

[4 of 4 positions shown; findings below may reference images not displayed]

FINDINGS: Several left lateral rib fractures, mainly 6-10 ribs, minimally
displaced. No evidence of pneumothorax.

Soft tissues are grossly normal.
IMPRESSION: Minimally displaced 6-10 left lateral rib fractures.

No evidence of pneumothorax.

## 2019-09-05 ENCOUNTER — Other Ambulatory Visit: Payer: Self-pay

## 2019-09-05 ENCOUNTER — Emergency Department (HOSPITAL_COMMUNITY): Payer: Medicare Other

## 2019-09-05 ENCOUNTER — Encounter (HOSPITAL_COMMUNITY): Payer: Self-pay

## 2019-09-05 ENCOUNTER — Emergency Department (HOSPITAL_COMMUNITY)
Admission: EM | Admit: 2019-09-05 | Discharge: 2019-09-05 | Disposition: A | Discharge status: Home / Self Care | Payer: Medicare Other | Attending: Emergency Medicine | Admitting: Emergency Medicine

## 2019-09-05 DIAGNOSIS — R002 Palpitations: Secondary | ICD-10-CM | POA: Diagnosis not present

## 2019-09-05 DIAGNOSIS — N183 Chronic kidney disease, stage 3 unspecified: Secondary | ICD-10-CM | POA: Insufficient documentation

## 2019-09-05 DIAGNOSIS — E039 Hypothyroidism, unspecified: Secondary | ICD-10-CM | POA: Diagnosis not present

## 2019-09-05 DIAGNOSIS — Z79899 Other long term (current) drug therapy: Secondary | ICD-10-CM | POA: Insufficient documentation

## 2019-09-05 DIAGNOSIS — Z7982 Long term (current) use of aspirin: Secondary | ICD-10-CM | POA: Diagnosis not present

## 2019-09-05 LAB — T4, FREE: Free T4: 1.11 ng/dL (ref 0.61–1.12)

## 2019-09-05 LAB — CBC
HCT: 41.6 % (ref 36.0–46.0)
Hemoglobin: 13.8 g/dL (ref 12.0–15.0)
MCH: 32.1 pg (ref 26.0–34.0)
MCHC: 33.2 g/dL (ref 30.0–36.0)
MCV: 96.7 fL (ref 80.0–100.0)
Platelets: 223 10*3/uL (ref 150–400)
RBC: 4.3 MIL/uL (ref 3.87–5.11)
RDW: 12.3 % (ref 11.5–15.5)
WBC: 6.7 10*3/uL (ref 4.0–10.5)
nRBC: 0 % (ref 0.0–0.2)

## 2019-09-05 LAB — HEPATIC FUNCTION PANEL
ALT: 25 U/L (ref 0–44)
AST: 37 U/L (ref 15–41)
Albumin: 4.4 g/dL (ref 3.5–5.0)
Alkaline Phosphatase: 64 U/L (ref 38–126)
Bilirubin, Direct: 0.1 mg/dL (ref 0.0–0.2)
Indirect Bilirubin: 0.4 mg/dL (ref 0.3–0.9)
Total Bilirubin: 0.5 mg/dL (ref 0.3–1.2)
Total Protein: 7.3 g/dL (ref 6.5–8.1)

## 2019-09-05 LAB — BASIC METABOLIC PANEL
Anion gap: 13 (ref 5–15)
BUN: 10 mg/dL (ref 8–23)
CO2: 23 mmol/L (ref 22–32)
Calcium: 9.3 mg/dL (ref 8.9–10.3)
Chloride: 103 mmol/L (ref 98–111)
Creatinine, Ser: 0.85 mg/dL (ref 0.44–1.00)
GFR calc Af Amer: >60 mL/min (ref >60)
GFR calc non Af Amer: >60 mL/min (ref >60)
Glucose, Bld: 118 mg/dL — ABNORMAL HIGH (ref 70–99)
Potassium: 4 mmol/L (ref 3.5–5.1)
Sodium: 139 mmol/L (ref 135–145)

## 2019-09-05 LAB — TROPONIN I (HIGH SENSITIVITY)
Troponin I (High Sensitivity): <2 ng/L (ref <18)
Troponin I (High Sensitivity): <2 ng/L (ref <18)

## 2019-09-05 LAB — TSH: TSH: 1.689 u[IU]/mL (ref 0.350–4.500)

## 2019-09-05 MED ORDER — SODIUM CHLORIDE 0.9% FLUSH: 3.0000 mL | Freq: Once | INTRAVENOUS | Status: DC

## 2019-09-05 NOTE — ED Provider Notes (Signed)
[facility] DEPT Provider Note   CSN: [id number] Arrival date & time: 09/05/19  1109     History   Chief Complaint Chief Complaint  Patient presents with  . Palpitations    HPI Kathy [name] is a 68 y.o. female.     HPI She with a history of hypercholesterolemia, thyroid dysfunction presents with concern for palpitations, chest pressure. Onset was 3 days ago. Where is the past she has had occasional episodes of brief, nonsustained chest pressure with palpitations, now, over the past 3 days that she has had nearly persistent palpitations, described as an irregularity of rhythm, not rate.  Line there is associated chest pressure. Interestingly, with activity, the sense of irregularity improves, and there is no increase in chest pressure. No dyspnea, no fever, chills, cough, nausea, vomiting, syncope. Patient is here with her husband, who assists with the HPI. She denies recent change in medication, beyond addition of additional vitamin supplements.  Past Medical History:  Diagnosis Date  . CKD (chronic kidney disease), stage III   . Rosacea   . Thyroid disease     Patient Active Problem List   Diagnosis Date Noted  . Borderline diabetes 02/22/2019  . Elevated LFTs 09/06/2015  . Hypothyroidism 04/29/2012  . Hypercholesteremia 04/29/2012  . Osteopenia     Past Surgical History:  Procedure Laterality Date  . BTL    . PILONIDAL CYST / SINUS EXCISION    . THYROID SURGERY  2008   partial thyroidectomy     OB History    Gravida  2   Para  2   Term      Preterm      AB  0   Living  2     SAB      TAB      Ectopic      Multiple      Live Births               Home Medications    Prior to Admission medications   Medication Sig Start Date End Date Taking? Authorizing Provider  acetaminophen (TYLENOL) 500 MG tablet Take 1,000 mg by mouth every 6 (six) hours as needed for mild pain.   Yes [provider]   ALPRAZolam (XANAX) 0.25 MG tablet Take 0.25 mg by mouth every 4 (four) hours as needed for anxiety.   Yes [provider]  aspirin EC 81 MG tablet Take 1 tablet (81 mg total) by mouth daily. 02/23/19  Yes Jettie Booze, MD  Biotin 1000 MCG tablet Take 1,000 mcg by mouth daily.    Yes [provider]  Calcium 200 MG TABS Take 200 mg by mouth daily.   Yes [provider]  Coenzyme Q10 (CO Q 10) 100 MG CAPS Take 100 mg by mouth daily.    Yes [provider]  levothyroxine (SYNTHROID, LEVOTHROID) 88 MCG tablet Take 88 mcg by mouth daily.   Yes [provider]  Multiple Vitamins-Minerals (MULTIVITAMINS THER. W/MINERALS) TABS Take 1 tablet by mouth daily.   Yes [provider]  Multiple Vitamins-Minerals (ZINC PO) Take 1 tablet by mouth daily.   Yes [provider]  omega-3 acid ethyl esters (LOVAZA) 1 g capsule TAKE 2 CAPSULES BY MOUTH 2 TIMES DAILY. Patient taking differently: Take 2 g by mouth 2 (two) times daily.  02/23/19  Yes Jettie Booze, MD  rosuvastatin (CRESTOR) 10 MG tablet Take 1 tablet (10 mg total) by mouth daily. 02/23/19  Yes Jettie Booze, MD  tretinoin (RETIN-A) 0.025 % cream Apply 1 application topically at bedtime as needed (on affected area of skin).    Yes [provider]  Vitamin D, Cholecalciferol, 1000 units CAPS Take 1,000 Units by mouth daily.   Yes [provider]  ibuprofen (ADVIL,MOTRIN) 600 MG tablet Take 1 tablet (600 mg total) by mouth every 8 (eight) hours as needed. Patient not taking: Reported on 09/05/2019 07/20/18   Jola Schmidt, MD    Family History Family History  Problem Relation Age of Onset  . Hypercholesterolemia Mother   . Diabetes Mellitus I Mother   . Prostate cancer Father   . Hypercholesterolemia Father   . AAA (abdominal aortic aneurysm) Father   . Stroke Other   . Heart failure Other     Social History Social History   Tobacco Use  . Smoking  status: Never Smoker  . Smokeless tobacco: Never Used  Substance Use Topics  . Alcohol use: Yes    Alcohol/week: 0.0 standard drinks  . Drug use: No     Allergies   Lipitor [atorvastatin], Zetia [ezetimibe], and Zocor [simvastatin]   Review of Systems Review of Systems  Constitutional:       Per HPI, otherwise negative  HENT:       Per HPI, otherwise negative  Respiratory:       Per HPI, otherwise negative  Cardiovascular:       Per HPI, otherwise negative  Gastrointestinal: Negative for vomiting.  Endocrine:       Negative aside from HPI  Genitourinary:       Neg aside from HPI   Musculoskeletal:       Per HPI, otherwise negative  Skin: Negative.   Neurological: Negative for syncope.     Physical Exam Updated Vital Signs BP 104/78   Pulse 63   Temp 98.5 F (36.9 C) (Oral)   Resp 20   Ht (S) 5\' 5"  (1.651 m)   Wt (S) 75.3 kg   SpO2 96%   BMI 27.62 kg/m   Physical Exam Vitals signs and nursing note reviewed.  Constitutional:      General: She is not in acute distress.    Appearance: She is well-developed.  HENT:     Head: Normocephalic and atraumatic.  Eyes:     Conjunctiva/sclera: Conjunctivae normal.  Cardiovascular:     Rate and Rhythm: Normal rate and regular rhythm.  Pulmonary:     Effort: Pulmonary effort is normal. No respiratory distress.     Breath sounds: Normal breath sounds. No stridor.  Abdominal:     General: There is no distension.  Skin:    General: Skin is warm and dry.  Neurological:     Mental Status: She is alert and oriented to person, place, and time.     Cranial Nerves: No cranial nerve deficit.      ED Treatments / Results  Labs (all labs ordered are listed, but only abnormal results are displayed) Labs Reviewed  BASIC METABOLIC PANEL - Abnormal; Notable for the following components:      Result Value   Glucose, Bld 118 (*)    All other components within normal limits  CBC  HEPATIC FUNCTION PANEL  TSH  T4, FREE   TROPONIN I (HIGH SENSITIVITY)  TROPONIN I (HIGH SENSITIVITY)    EKG EKG Interpretation  Date/Time:  Sunday September 05 2019 11:24:36 EDT Ventricular Rate:  73 PR Interval:    QRS Duration: 94 QT Interval:  392 QTC Calculation: 432 R Axis:   -86 Text Interpretation:  Sinus rhythm Left anterior fascicular block Low voltage, precordial leads No significant change since last tracing Abnormal ECG Confirmed by Carmin Muskrat [phone]) on 09/05/2019 12:02:45 PM   Continuous cardiac monitoring notable for PAC while in the room, otherwise sinus rhythm, rate 70/80.  Radiology Dg Chest 2 View  Result Date: 09/05/2019 CLINICAL DATA:  Chest palpitations for 3 days. Worsening since last night. EXAM: CHEST - 2 VIEW COMPARISON:  07/20/2018 FINDINGS: The heart size and mediastinal contours are within normal limits. Both lungs are clear. The visualized skeletal structures are unremarkable. IMPRESSION: No active cardiopulmonary disease. Electronically Signed   By: Kathreen Devoid   On: 09/05/2019 12:15      Procedures Procedures (including critical care time)  Medications Ordered in ED Medications - No data to display   Initial Impression / Assessment and Plan / ED Course  I have reviewed the triage vital signs and the nursing notes.  Pertinent labs & imaging results that were available during my care of the patient were reviewed by me and considered in my medical decision making ([name] chart for details).        4:04 PM On repeat exam the patient is awake, alert, no distress, states that she feels better. Labs here are reassuring, TSH appropriate, T4 pending, troponins, x2 reassuring, EKG nonischemic, with occasional PAC only. With no evidence for ongoing ischemia, no sustained dysrhythmia, and her improvement here, as well as with reassuring other findings, no evidence for pneumonia or other infectious etiology, patient is appropriate for discharge with following up with her cardiologist.   Final Clinical Impressions(s) / ED Diagnoses   Final diagnoses:  Palpitations      Carmin Muskrat, MD 09/05/19 1605

## 2019-09-05 NOTE — ED Triage Notes (Signed)
Pt states that she has been having heart palpitations x 3 days. Pt states that they have been off and on, but today it would not stop. Pt states that she recently started taking 1 zinc/day, and 4 vitamin D3/day

## 2019-09-05 NOTE — Discharge Instructions (Addendum)
As discussed, your evaluation today has been largely reassuring.  But, it is important that you monitor your condition carefully, and do not hesitate to return to the ED if you develop new, or concerning changes in your condition.  Please follow-up with your physician for appropriate ongoing care.  

## 2020-01-07 ENCOUNTER — Other Ambulatory Visit: Payer: Self-pay | Admitting: Women's Health

## 2020-01-07 DIAGNOSIS — Z1231 Encounter for screening mammogram for malignant neoplasm of breast: Secondary | ICD-10-CM

## 2020-01-10 ENCOUNTER — Other Ambulatory Visit: Payer: Self-pay

## 2020-01-10 ENCOUNTER — Ambulatory Visit
Admission: RE | Admit: 2020-01-10 | Discharge: 2020-01-10 | Disposition: A | Discharge status: Home / Self Care | Payer: 59 | Source: Ambulatory Visit

## 2020-01-10 DIAGNOSIS — Z1231 Encounter for screening mammogram for malignant neoplasm of breast: Secondary | ICD-10-CM | POA: Diagnosis not present

## 2020-01-17 ENCOUNTER — Ambulatory Visit: Payer: Medicare Other | Admitting: Interventional Cardiology

## 2020-01-21 ENCOUNTER — Telehealth: Payer: Self-pay | Admitting: Interventional Cardiology

## 2020-01-21 DIAGNOSIS — E7800 Pure hypercholesterolemia, unspecified: Secondary | ICD-10-CM

## 2020-01-21 NOTE — Telephone Encounter (Signed)
Left detailed message for patient letting her know that I have scheduled fasting LIPIDS on 4/12. Instructed for the patient to call back with any conflicts.

## 2020-01-21 NOTE — Telephone Encounter (Signed)
Patient wants to know if she needs to have lab work done prior to her appt on 02/24/20 with Dr. Irish Lack. She states if she does, she would like to get them done on 02/21/20 since she already has an appt in [place] that day. She just wants to make sure this will be enough time for the results to get back by her appt with Dr. Irish Lack. Please advise. You can leave a message on VM or send a message through MyChart if she does not answer.

## 2020-01-21 NOTE — Telephone Encounter (Signed)
OK to check lipids before visit.  Kathy [name]

## 2020-01-21 NOTE — Telephone Encounter (Signed)
Left message letting patient know that I will check with Dr. Irish Lack to see if he would like to order Fasting labs for her on 4/12 before her appt on 4/15.

## 2020-02-05 ENCOUNTER — Other Ambulatory Visit: Payer: Self-pay | Admitting: Interventional Cardiology

## 2020-02-18 ENCOUNTER — Other Ambulatory Visit: Payer: Self-pay

## 2020-02-21 ENCOUNTER — Other Ambulatory Visit: Payer: Self-pay

## 2020-02-21 ENCOUNTER — Encounter: Payer: Self-pay | Admitting: Women's Health

## 2020-02-21 ENCOUNTER — Other Ambulatory Visit: Payer: 59 | Admitting: *Deleted

## 2020-02-21 ENCOUNTER — Ambulatory Visit (INDEPENDENT_AMBULATORY_CARE_PROVIDER_SITE_OTHER): Payer: Medicare Other | Admitting: Women's Health

## 2020-02-21 VITALS — BP 124/80 | Ht 65.0 in | Wt 169.0 lb

## 2020-02-21 DIAGNOSIS — Z01419 Encounter for gynecological examination (general) (routine) without abnormal findings: Secondary | ICD-10-CM | POA: Diagnosis not present

## 2020-02-21 DIAGNOSIS — E7800 Pure hypercholesterolemia, unspecified: Secondary | ICD-10-CM

## 2020-02-21 LAB — LIPID PANEL
Chol/HDL Ratio: 2.9 ratio (ref 0.0–4.4)
Cholesterol, Total: 227 mg/dL — ABNORMAL HIGH (ref 100–199)
HDL: 78 mg/dL (ref >39)
LDL Chol Calc (NIH): 126 mg/dL — ABNORMAL HIGH (ref 0–99)
Triglycerides: 133 mg/dL (ref 0–149)
VLDL Cholesterol Cal: 23 mg/dL (ref 5–40)
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
## 2020-02-21 NOTE — Patient Instructions (Signed)
Vit D 2000 iu daily Pleasure knowing you! Try YOGA Health Maintenance After Age 71 After age 60, you are at a higher risk for certain long-term diseases and infections as well as injuries from falls. Falls are a major cause of broken bones and head injuries in people who are older than age 22. Getting regular preventive care can help to keep you healthy and well. Preventive care includes getting regular testing and making lifestyle changes as recommended by your health care provider. Talk with your health care provider about:  Which screenings and tests you should have. A screening is a test that checks for a disease when you have no symptoms.  A diet and exercise plan that is right for you. What should I know about screenings and tests to prevent falls? Screening and testing are the best ways to find a health problem early. Early diagnosis and treatment give you the best chance of managing medical conditions that are common after age 29. Certain conditions and lifestyle choices may make you more likely to have a fall. Your health care provider may recommend:  Regular vision checks. Poor vision and conditions such as cataracts can make you more likely to have a fall. If you wear glasses, make sure to get your prescription updated if your vision changes.  Medicine review. Work with your health care provider to regularly review all of the medicines you are taking, including over-the-counter medicines. Ask your health care provider about any side effects that may make you more likely to have a fall. Tell your health care provider if any medicines that you take make you feel dizzy or sleepy.  Osteoporosis screening. Osteoporosis is a condition that causes the bones to get weaker. This can make the bones weak and cause them to break more easily.  Blood pressure screening. Blood pressure changes and medicines to control blood pressure can make you feel dizzy.  Strength and balance checks. Your health  care provider may recommend certain tests to check your strength and balance while standing, walking, or changing positions.  Foot health exam. Foot pain and numbness, as well as not wearing proper footwear, can make you more likely to have a fall.  Depression screening. You may be more likely to have a fall if you have a fear of falling, feel emotionally low, or feel unable to do activities that you used to do.  Alcohol use screening. Using too much alcohol can affect your balance and may make you more likely to have a fall. What actions can I take to lower my risk of falls? General instructions  Talk with your health care provider about your risks for falling. Tell your health care provider if: ? You fall. Be sure to tell your health care provider about all falls, even ones that seem minor. ? You feel dizzy, sleepy, or off-balance.  Take over-the-counter and prescription medicines only as told by your health care provider. These include any supplements.  Eat a healthy diet and maintain a healthy weight. A healthy diet includes low-fat dairy products, low-fat (lean) meats, and fiber from whole grains, beans, and lots of fruits and vegetables. Home safety  Remove any tripping hazards, such as rugs, cords, and clutter.  Install safety equipment such as grab bars in bathrooms and safety rails on stairs.  Keep rooms and walkways well-lit. Activity   Follow a regular exercise program to stay fit. This will help you maintain your balance. Ask your health care provider what types of exercise are  appropriate for you.  If you need a cane or walker, use it as recommended by your health care provider.  Wear supportive shoes that have nonskid soles. Lifestyle  Do not drink alcohol if your health care provider tells you not to drink.  If you drink alcohol, limit how much you have: ? 0-1 drink a day for women. ? 0-2 drinks a day for men.  Be aware of how much alcohol is in your drink. In  the [place] one drink equals one typical bottle of beer (12 oz), one-half glass of wine (5 oz), or one shot of hard liquor (1 oz).  Do not use any products that contain nicotine or tobacco, such as cigarettes and e-cigarettes. If you need help quitting, ask your health care provider. Summary  Having a healthy lifestyle and getting preventive care can help to protect your health and wellness after age 71.  Screening and testing are the best way to find a health problem early and help you avoid having a fall. Early diagnosis and treatment give you the best chance for managing medical conditions that are more common for people who are older than age 48.  Falls are a major cause of broken bones and head injuries in people who are older than age 77. Take precautions to prevent a fall at home.  Work with your health care provider to learn what changes you can make to improve your health and wellness and to prevent falls. This information is not intended to replace advice given to you by your health care provider. Make sure you discuss any questions you have with your health care provider. Document Revised: 02/18/2019 Document Reviewed: 09/10/2017 Elsevier Patient Education  2020 [employer].

## 2020-02-23 NOTE — Progress Notes (Signed)
Cardiology Office Note   Date:  02/24/2020   ID:  Kathy [name], DOB 1951/05/30, MRN [id number]  PCP:  Shirline Frees, MD    No chief complaint on file.  Hyperlipidemia  Wt Readings from Last 3 Encounters:  02/24/20 169 lb 1.9 oz (76.7 kg)  02/21/20 169 lb (76.7 kg)  09/05/19 (S) 166 lb 0.1 oz (75.3 kg)       History of Present Illness: Kathy [name] is a 69 y.o. female  with a history of high cholesterol who has been intolerant of Zetia, Lipitor, Zocor. Crestor alone was tolerated but in combination with Zetia, she had severe stomach upset, vomiting. She has been managed in the lipid clinic.   She has been on Lovaza and Crestor 10 mg.  With 20 mg of Crestor, she had nausea.  She had nausea with Zetia.    Lipids in April 2021 showed a significant increase in her total cholesterol.  Exercise had dropped off in 2020.    Palpitations in 08/2019 prompted trip to ER.      Past Medical History:  Diagnosis Date  . CKD (chronic kidney disease), stage III   . Rosacea   . Thyroid disease     Past Surgical History:  Procedure Laterality Date  . BTL    . PILONIDAL CYST / SINUS EXCISION    . THYROID SURGERY  2008   partial thyroidectomy     Current Outpatient Medications  Medication Sig Dispense Refill  . ALPRAZolam (XANAX) 0.25 MG tablet Take 0.25 mg by mouth every 4 (four) hours as needed for anxiety.    [name] aspirin EC 81 MG tablet Take 1 tablet (81 mg total) by mouth daily. 90 tablet 3  . Biotin 1000 MCG tablet Take 1,000 mcg by mouth daily.     . Coenzyme Q10 (CO Q 10) 100 MG CAPS Take 100 mg by mouth daily.     . hydrOXYzine (ATARAX/VISTARIL) 10 MG tablet Take 10 mg by mouth daily as needed.    [name] levothyroxine (SYNTHROID, LEVOTHROID) 88 MCG tablet Take 88 mcg by mouth daily.    . magnesium 30 MG tablet Take 30 mg by mouth 2 (two) times daily.    . Multiple Vitamins-Minerals (MULTIVITAMINS THER. W/MINERALS) TABS Take 1 tablet by mouth daily.    . Multiple  Vitamins-Minerals (ZINC PO) Take 1 tablet by mouth daily.    [name] omega-3 acid ethyl esters (LOVAZA) 1 g capsule TAKE 2 CAPSULES BY MOUTH TWICE DAILY. 360 capsule 3  . rosuvastatin (CRESTOR) 10 MG tablet Take 1 tablet (10 mg total) by mouth daily. 90 tablet 3  . tretinoin (RETIN-A) 0.05 % cream APPLY TO AFFECTED AREA ON THE SKIN AT BEDTIME.    [name] Vitamin D, Cholecalciferol, 1000 units CAPS Take 1,000 Units by mouth daily.    [name] zinc gluconate 50 MG tablet Take 50 mg by mouth daily.     No current facility-administered medications for this visit.    Allergies:   Lipitor [atorvastatin], Zetia [ezetimibe], and Zocor [simvastatin]    Social History:  The patient  reports that she has never smoked. She has never used smokeless tobacco. She reports current alcohol use. She reports that she does not use drugs.   Family History:  The patient's family history includes AAA (abdominal aortic aneurysm) in her father; Diabetes Mellitus I in her mother; Heart failure in an other family member; Hypercholesterolemia in her father and mother; Prostate cancer in her father; Stroke in an  other family member.    ROS:  Please see the history of present illness.   Otherwise, review of systems are positive for less exercise.   All other systems are reviewed and negative.    PHYSICAL EXAM: VS:  BP (!) 146/82   Pulse 66   Ht 5\' 5"  (1.651 m)   Wt 169 lb 1.9 oz (76.7 kg)   SpO2 97%   BMI 28.14 kg/m  , BMI Body mass index is 28.14 kg/m. GEN: Well nourished, well developed, in no acute distress  HEENT: normal  Neck: no JVD, carotid bruits, or masses Cardiac: RRR; no murmurs, rubs, or gallops,no edema  Respiratory:  clear to auscultation bilaterally, normal work of breathing GI: soft, nontender, nondistended, + BS MS: no deformity or atrophy  Skin: warm and dry, no rash Neuro:  Strength and sensation are intact Psych: euthymic mood, full affect   EKG:   The ekg ordered 08/2019 demonstrates NSR, no ST  changes   Recent Labs: 09/05/2019: ALT 25; BUN 10; Creatinine, Ser 0.85; Hemoglobin 13.8; Platelets 223; Potassium 4.0; Sodium 139; TSH 1.689   Lipid Panel    Component Value Date/Time   CHOL 227 (H) 02/21/2020 1013   CHOL 216 (H) 09/03/2016 0754   CHOL 214 (H) 03/08/2015 0755   TRIG 133 02/21/2020 1013   TRIG 181 (H) 09/03/2016 0754   TRIG 148 03/08/2015 0755   HDL 78 02/21/2020 1013   HDL 66 09/03/2016 0754   HDL 70 03/08/2015 0755   CHOLHDL 2.9 02/21/2020 1013   CHOLHDL 3.3 09/03/2016 0754   LDLCALC 126 (H) 02/21/2020 1013   LDLCALC 120 (H) 09/03/2016 0754   LDLCALC 114 (H) 03/08/2015 0755     Other studies Reviewed: Additional studies/ records that were reviewed today with results demonstrating: LDL 126 in 4/21.   ASSESSMENT AND PLAN:  1. Hyperlipidemia: Whole food, plant-based diet recommended.  Continue lipid-lowering therapy with rosuvastatin and Lovaza.  She has not tolerated higher doses of Crestor in the past. 2. Elevated LFTs: COntrolled in 08/2019. 3. Borderline diabetes: Keep sugars down to help manage triglycerides.   4. BP controlled at other visits and at home.  5. Normal TSH.  Hypothyroid being undertreated could increase cholesterol as well, but it appears this is controlled.    Current medicines are reviewed at length with the patient today.  The patient concerns regarding her medicines were addressed.  The following changes have been made:  No change  Labs/ tests ordered today include:  No orders of the defined types were placed in this encounter.   Recommend 150 minutes/week of aerobic exercise Low fat, low carb, high fiber diet recommended  Disposition:   FU in 1 year    Signed, Larae Grooms, MD  02/24/2020 10:45 AM    [address], [place], [place]  [address] Phone: [phone]; Fax: [phone]

## 2020-02-24 ENCOUNTER — Other Ambulatory Visit: Payer: Self-pay

## 2020-02-24 ENCOUNTER — Ambulatory Visit (INDEPENDENT_AMBULATORY_CARE_PROVIDER_SITE_OTHER): Payer: Medicare Other | Admitting: Interventional Cardiology

## 2020-02-24 ENCOUNTER — Encounter: Payer: Self-pay | Admitting: Interventional Cardiology

## 2020-02-24 VITALS — BP 146/82 | HR 66 | Ht 65.0 in | Wt 169.1 lb

## 2020-02-24 DIAGNOSIS — R7989 Other specified abnormal findings of blood chemistry: Secondary | ICD-10-CM

## 2020-02-24 DIAGNOSIS — E7800 Pure hypercholesterolemia, unspecified: Secondary | ICD-10-CM | POA: Diagnosis not present

## 2020-02-24 DIAGNOSIS — E039 Hypothyroidism, unspecified: Secondary | ICD-10-CM

## 2020-02-24 DIAGNOSIS — R7303 Prediabetes: Secondary | ICD-10-CM | POA: Diagnosis not present

## 2020-02-24 MED ORDER — OMEGA-3-ACID ETHYL ESTERS 1 G PO CAPS: ORAL_CAPSULE | ORAL | 3 refills | Status: DC

## 2020-02-24 MED ORDER — ROSUVASTATIN CALCIUM 10 MG PO TABS: 10.0000 mg | ORAL_TABLET | Freq: Every day | ORAL | 3 refills | Status: DC

## 2020-02-24 NOTE — Patient Instructions (Signed)
Medication Instructions:  Your physician recommends that you continue on your current medications as directed. Please refer to the Current Medication list given to you today.  *If you need a refill on your cardiac medications before your next appointment, please call your pharmacy*   Lab Work: None ordered  If you have labs (blood work) drawn today and your tests are completely normal, you will receive your results only by: . MyChart Message (if you have MyChart) OR . A paper copy in the mail If you have any lab test that is abnormal or we need to change your treatment, we will call you to review the results.   Testing/Procedures: None ordered   Follow-Up: At [facility], you and your health needs are our priority.  As part of our continuing mission to provide you with exceptional heart care, we have created designated Provider Care Teams.  These Care Teams include your primary Cardiologist (physician) and Advanced Practice Providers (APPs -  Physician Assistants and Nurse Practitioners) who all work together to provide you with the care you need, when you need it.  We recommend signing up for the patient portal called "MyChart".  Sign up information is provided on this After Visit Summary.  MyChart is used to connect with patients for Virtual Visits (Telemedicine).  Patients are able to view lab/test results, encounter notes, upcoming appointments, etc.  Non-urgent messages can be sent to your provider as well.   To learn more about what you can do with MyChart, go to [email or web address].    Your next appointment:   12 month(s)  The format for your next appointment:   In Person  Provider:   You may see Jayadeep Varanasi, MD or one of the following Advanced Practice Providers on your designated Care Team:    Dayna Dunn, PA-C  Michele Lenze, PA-C    Other Instructions  High-Fiber Diet Fiber, also called dietary fiber, is a type of carbohydrate that is found in fruits,  vegetables, whole grains, and beans. A high-fiber diet can have many health benefits. Your health care provider may recommend a high-fiber diet to help:  Prevent constipation. Fiber can make your bowel movements more regular.  Lower your cholesterol.  Relieve the following conditions: ? Swelling of veins in the anus (hemorrhoids). ? Swelling and irritation (inflammation) of specific areas of the digestive tract (uncomplicated diverticulosis). ? A problem of the large intestine (colon) that sometimes causes pain and diarrhea (irritable bowel syndrome, IBS).  Prevent overeating as part of a weight-loss plan.  Prevent heart disease, type 2 diabetes, and certain cancers. What is my plan? The recommended daily fiber intake in grams (g) includes:  38 g for men age 50 or younger.  30 g for men over age 50.  25 g for women age 50 or younger.  21 g for women over age 50. You can get the recommended daily intake of dietary fiber by:  Eating a variety of fruits, vegetables, grains, and beans.  Taking a fiber supplement, if it is not possible to get enough fiber through your diet. What do I need to know about a high-fiber diet?  It is better to get fiber through food sources rather than from fiber supplements. There is not a lot of research about how effective supplements are.  Always check the fiber content on the nutrition facts label of any prepackaged food. Look for foods that contain 5 g of fiber or more per serving.  Talk with a diet and nutrition specialist (  dietitian) if you have questions about specific foods that are recommended or not recommended for your medical condition, especially if those foods are not listed below.  Gradually increase how much fiber you consume. If you increase your intake of dietary fiber too quickly, you may have bloating, cramping, or gas.  Drink plenty of water. Water helps you to digest fiber. What are tips for following this plan?  Eat a wide  variety of high-fiber foods.  Make sure that half of the grains that you eat each day are whole grains.  Eat breads and cereals that are made with whole-grain flour instead of refined flour or white flour.  Eat brown rice, bulgur wheat, or millet instead of white rice.  Start the day with a breakfast that is high in fiber, such as a cereal that contains 5 g of fiber or more per serving.  Use beans in place of meat in soups, salads, and pasta dishes.  Eat high-fiber snacks, such as berries, raw vegetables, nuts, and popcorn.  Choose whole fruits and vegetables instead of processed forms like juice or sauce. What foods can I eat?  Fruits Berries. Pears. Apples. Oranges. Avocado. Prunes and raisins. Dried figs. Vegetables Sweet potatoes. Spinach. Kale. Artichokes. Cabbage. Broccoli. Cauliflower. Green peas. Carrots. Squash. Grains Whole-grain breads. Multigrain cereal. Oats and oatmeal. Brown rice. Barley. Bulgur wheat. Millet. Quinoa. Bran muffins. Popcorn. Rye wafer crackers. Meats and other proteins Navy, kidney, and pinto beans. Soybeans. Split peas. Lentils. Nuts and seeds. Dairy Fiber-fortified yogurt. Beverages Fiber-fortified soy milk. Fiber-fortified orange juice. Other foods Fiber bars. The items listed above may not be a complete list of recommended foods and beverages. Contact a dietitian for more options. What foods are not recommended? Fruits Fruit juice. Cooked, strained fruit. Vegetables Fried potatoes. Canned vegetables. Well-cooked vegetables. Grains White bread. Pasta made with refined flour. White rice. Meats and other proteins Fatty cuts of meat. Fried chicken or fried fish. Dairy Milk. Yogurt. Cream cheese. Sour cream. Fats and oils Butters. Beverages Soft drinks. Other foods Cakes and pastries. The items listed above may not be a complete list of foods and beverages to avoid. Contact a dietitian for more information. Summary  Fiber is a type of  carbohydrate. It is found in fruits, vegetables, whole grains, and beans.  There are many health benefits of eating a high-fiber diet, such as preventing constipation, lowering blood cholesterol, helping with weight loss, and reducing your risk of heart disease, diabetes, and certain cancers.  Gradually increase your intake of fiber. Increasing too fast can result in cramping, bloating, and gas. Drink plenty of water while you increase your fiber.  The best sources of fiber include whole fruits and vegetables, whole grains, nuts, seeds, and beans. This information is not intended to replace advice given to you by your health care provider. Make sure you discuss any questions you have with your health care provider. Document Revised: 09/01/2017 Document Reviewed: 09/01/2017 Elsevier Patient Education  2020 Elsevier Inc.   

## 2020-04-27 DIAGNOSIS — E7800 Pure hypercholesterolemia, unspecified: Secondary | ICD-10-CM | POA: Diagnosis not present

## 2020-04-27 DIAGNOSIS — R7303 Prediabetes: Secondary | ICD-10-CM | POA: Diagnosis not present

## 2020-04-27 DIAGNOSIS — Z0000 Encounter for general adult medical examination without abnormal findings: Secondary | ICD-10-CM | POA: Diagnosis not present

## 2020-04-27 DIAGNOSIS — R7309 Other abnormal glucose: Secondary | ICD-10-CM | POA: Diagnosis not present

## 2020-04-27 DIAGNOSIS — M8588 Other specified disorders of bone density and structure, other site: Secondary | ICD-10-CM | POA: Diagnosis not present

## 2020-04-27 DIAGNOSIS — E039 Hypothyroidism, unspecified: Secondary | ICD-10-CM | POA: Diagnosis not present

## 2020-04-27 DIAGNOSIS — E559 Vitamin D deficiency, unspecified: Secondary | ICD-10-CM | POA: Diagnosis not present

## 2020-06-19 ENCOUNTER — Other Ambulatory Visit: Payer: Self-pay | Admitting: Family Medicine

## 2020-06-19 DIAGNOSIS — M8588 Other specified disorders of bone density and structure, other site: Secondary | ICD-10-CM

## 2020-07-12 ENCOUNTER — Other Ambulatory Visit: Payer: Self-pay

## 2020-07-12 ENCOUNTER — Ambulatory Visit
Admission: RE | Admit: 2020-07-12 | Discharge: 2020-07-12 | Disposition: A | Discharge status: Home / Self Care | Payer: 59 | Source: Ambulatory Visit | Attending: Family Medicine | Admitting: Family Medicine

## 2020-07-12 DIAGNOSIS — M8589 Other specified disorders of bone density and structure, multiple sites: Secondary | ICD-10-CM | POA: Diagnosis not present

## 2020-07-12 DIAGNOSIS — Z780 Asymptomatic menopausal state: Secondary | ICD-10-CM | POA: Diagnosis not present

## 2020-07-12 DIAGNOSIS — M8588 Other specified disorders of bone density and structure, other site: Secondary | ICD-10-CM

## 2020-08-08 DIAGNOSIS — L821 Other seborrheic keratosis: Secondary | ICD-10-CM | POA: Diagnosis not present

## 2020-08-08 DIAGNOSIS — D1801 Hemangioma of skin and subcutaneous tissue: Secondary | ICD-10-CM | POA: Diagnosis not present

## 2020-08-08 DIAGNOSIS — D225 Melanocytic nevi of trunk: Secondary | ICD-10-CM | POA: Diagnosis not present

## 2020-08-08 DIAGNOSIS — L814 Other melanin hyperpigmentation: Secondary | ICD-10-CM | POA: Diagnosis not present

## 2020-08-08 DIAGNOSIS — L718 Other rosacea: Secondary | ICD-10-CM | POA: Diagnosis not present

## 2020-10-02 DIAGNOSIS — J0100 Acute maxillary sinusitis, unspecified: Secondary | ICD-10-CM | POA: Diagnosis not present

## 2020-10-11 IMAGING — CR DG CHEST 2V
2 series · 2 of 2 positions shown · non-contrast
Comparison: 07/20/2018

CLINICAL DATA: Chest palpitations for 3 days. Worsening since last
night.

EXAM:
CHEST - 2 VIEW

[w chest pa]
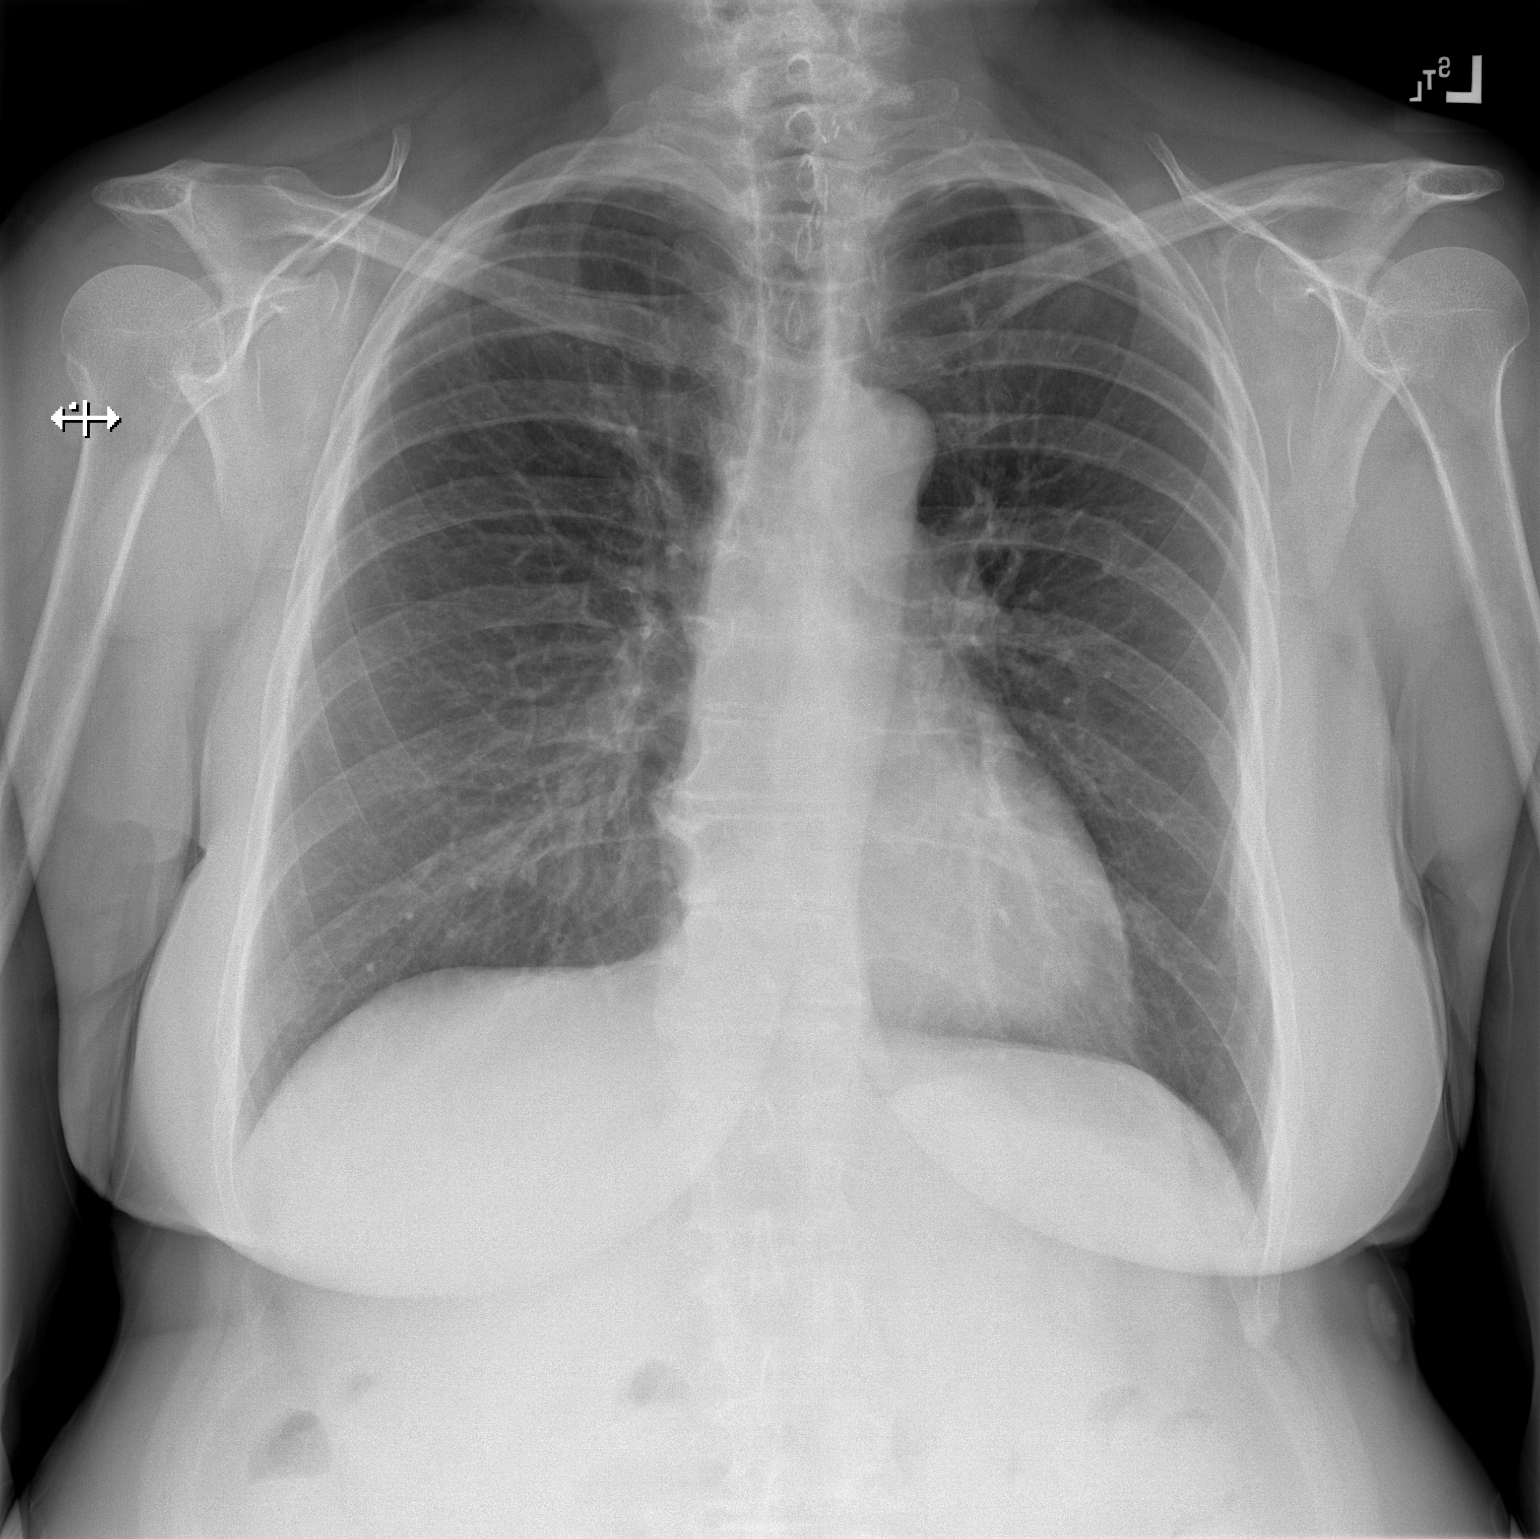

[w chest lat]
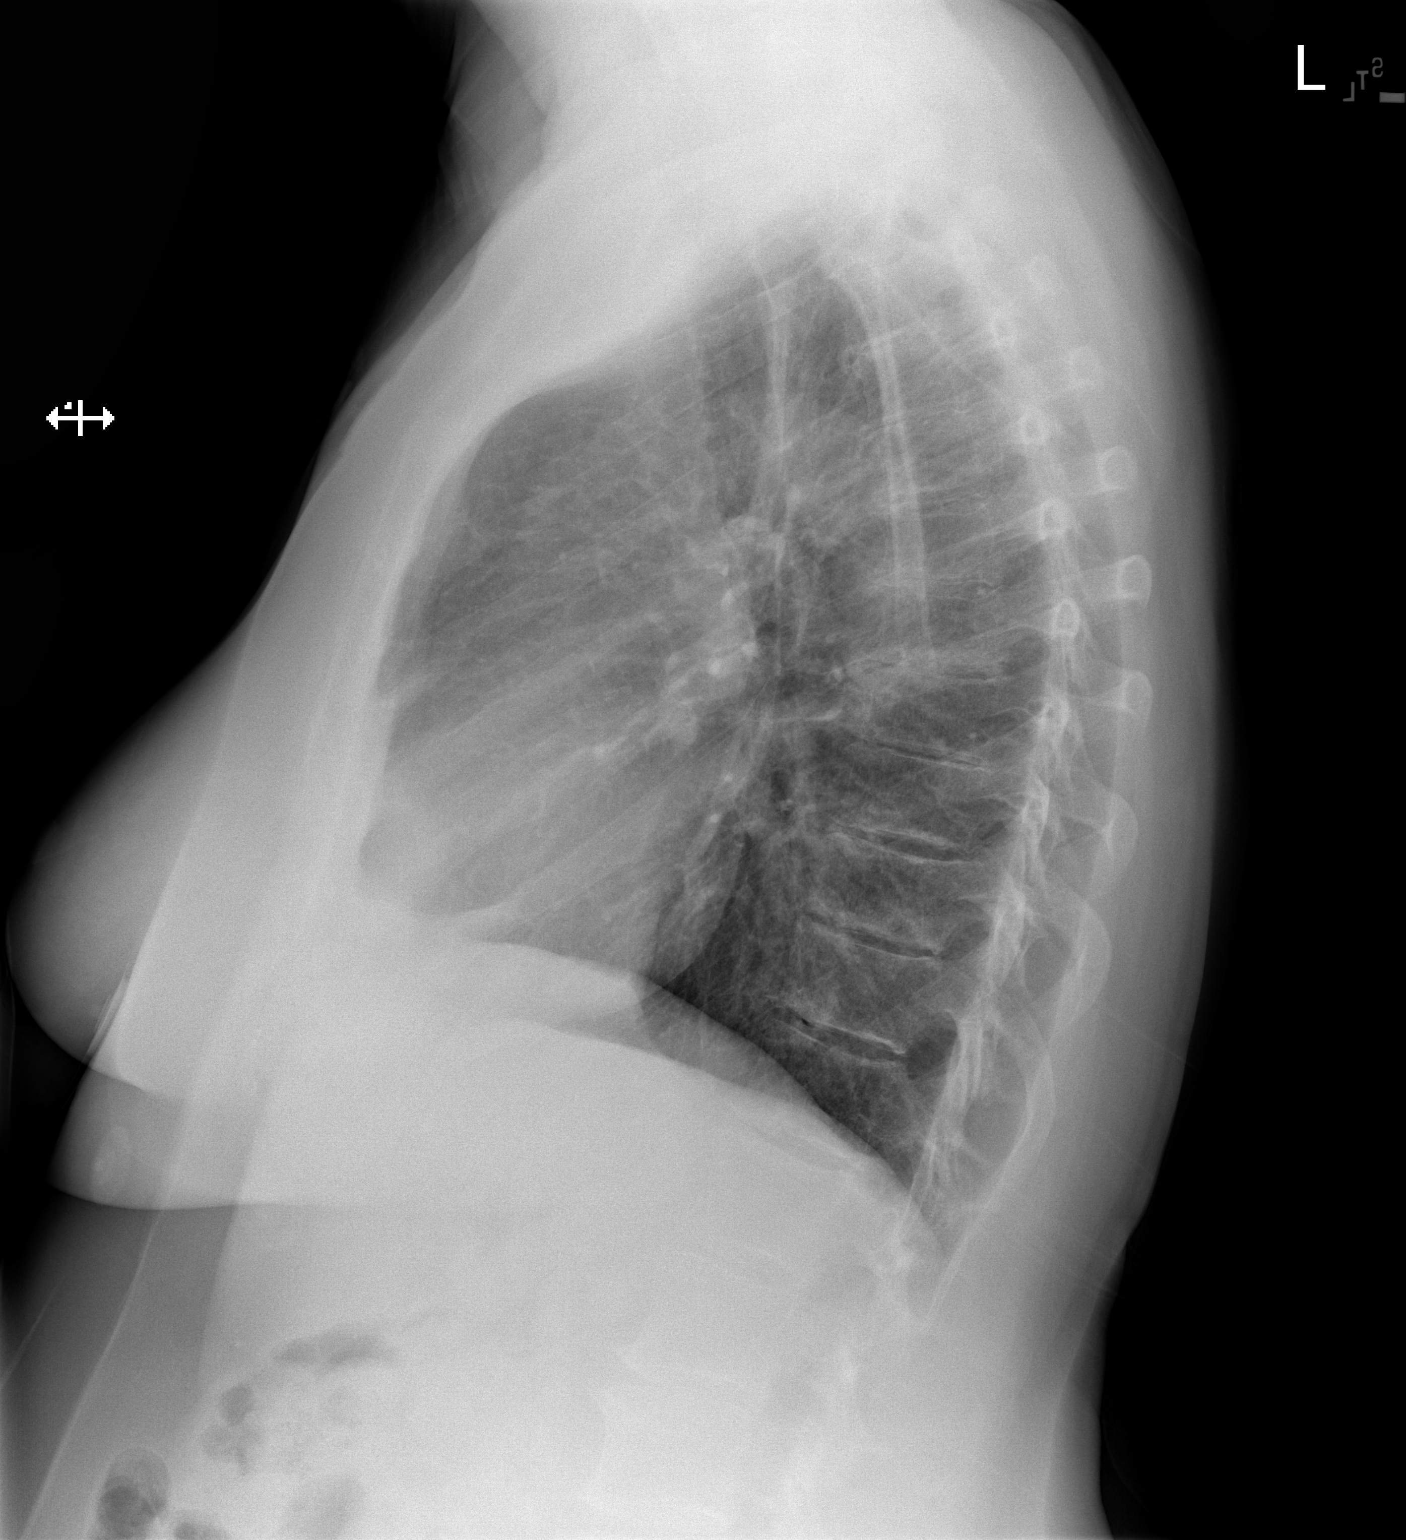

[2 of 2 positions shown; findings below may reference images not displayed]

FINDINGS: The heart size and mediastinal contours are within normal limits.
Both lungs are clear. The visualized skeletal structures are
unremarkable.
IMPRESSION: No active cardiopulmonary disease.

## 2020-11-16 DIAGNOSIS — L578 Other skin changes due to chronic exposure to nonionizing radiation: Secondary | ICD-10-CM | POA: Diagnosis not present

## 2020-11-16 DIAGNOSIS — L718 Other rosacea: Secondary | ICD-10-CM | POA: Diagnosis not present

## 2020-11-16 DIAGNOSIS — L570 Actinic keratosis: Secondary | ICD-10-CM | POA: Diagnosis not present

## 2020-12-07 DIAGNOSIS — R509 Fever, unspecified: Secondary | ICD-10-CM | POA: Diagnosis not present

## 2021-01-11 ENCOUNTER — Other Ambulatory Visit: Payer: Self-pay | Admitting: Family Medicine

## 2021-01-11 DIAGNOSIS — Z1231 Encounter for screening mammogram for malignant neoplasm of breast: Secondary | ICD-10-CM

## 2021-02-09 DIAGNOSIS — E7800 Pure hypercholesterolemia, unspecified: Secondary | ICD-10-CM

## 2021-03-02 ENCOUNTER — Ambulatory Visit
Admission: RE | Admit: 2021-03-02 | Discharge: 2021-03-02 | Disposition: A | Discharge status: Home / Self Care | Payer: 59 | Source: Ambulatory Visit | Attending: Family Medicine | Admitting: Family Medicine

## 2021-03-02 ENCOUNTER — Other Ambulatory Visit: Payer: Self-pay

## 2021-03-02 DIAGNOSIS — Z1231 Encounter for screening mammogram for malignant neoplasm of breast: Secondary | ICD-10-CM | POA: Diagnosis not present

## 2021-03-06 ENCOUNTER — Other Ambulatory Visit: Payer: Self-pay | Admitting: Family Medicine

## 2021-03-06 DIAGNOSIS — R928 Other abnormal and inconclusive findings on diagnostic imaging of breast: Secondary | ICD-10-CM

## 2021-03-16 ENCOUNTER — Ambulatory Visit: Payer: Medicare Other

## 2021-03-16 ENCOUNTER — Other Ambulatory Visit: Payer: Self-pay

## 2021-03-16 ENCOUNTER — Ambulatory Visit
Admission: RE | Admit: 2021-03-16 | Discharge: 2021-03-16 | Disposition: A | Discharge status: Home / Self Care | Payer: 59 | Source: Ambulatory Visit | Attending: Family Medicine | Admitting: Family Medicine

## 2021-03-16 DIAGNOSIS — R922 Inconclusive mammogram: Secondary | ICD-10-CM | POA: Diagnosis not present

## 2021-03-16 DIAGNOSIS — R928 Other abnormal and inconclusive findings on diagnostic imaging of breast: Secondary | ICD-10-CM

## 2021-03-21 ENCOUNTER — Other Ambulatory Visit: Payer: Medicare Other

## 2021-04-13 ENCOUNTER — Other Ambulatory Visit: Payer: Self-pay

## 2021-04-13 ENCOUNTER — Other Ambulatory Visit: Payer: Medicare Other | Admitting: *Deleted

## 2021-04-13 DIAGNOSIS — E7800 Pure hypercholesterolemia, unspecified: Secondary | ICD-10-CM | POA: Diagnosis not present

## 2021-04-13 LAB — COMPREHENSIVE METABOLIC PANEL
ALT: 19 IU/L (ref 0–32)
AST: 32 IU/L (ref 0–40)
Albumin/Globulin Ratio: 2.4 — ABNORMAL HIGH (ref 1.2–2.2)
Albumin: 4.7 g/dL (ref 3.7–4.7)
Alkaline Phosphatase: 81 IU/L (ref 44–121)
BUN/Creatinine Ratio: 9 — ABNORMAL LOW (ref 12–28)
BUN: 8 mg/dL (ref 8–27)
Bilirubin Total: 0.5 mg/dL (ref 0.0–1.2)
CO2: 23 mmol/L (ref 20–29)
Calcium: 9.4 mg/dL (ref 8.7–10.3)
Chloride: 102 mmol/L (ref 96–106)
Creatinine, Ser: 0.86 mg/dL (ref 0.57–1.00)
Globulin, Total: 2 g/dL (ref 1.5–4.5)
Glucose: 112 mg/dL — ABNORMAL HIGH (ref 65–99)
Potassium: 4.4 mmol/L (ref 3.5–5.2)
Sodium: 139 mmol/L (ref 134–144)
Total Protein: 6.7 g/dL (ref 6.0–8.5)
eGFR: 72 mL/min/{1.73_m2} (ref >59)

## 2021-04-13 LAB — LIPID PANEL
Chol/HDL Ratio: 2.5 ratio (ref 0.0–4.4)
Cholesterol, Total: 185 mg/dL (ref 100–199)
HDL: 73 mg/dL (ref >39)
LDL Chol Calc (NIH): 90 mg/dL (ref 0–99)
Triglycerides: 129 mg/dL (ref 0–149)
VLDL Cholesterol Cal: 22 mg/dL (ref 5–40)

## 2021-04-23 ENCOUNTER — Other Ambulatory Visit: Payer: Self-pay | Admitting: Interventional Cardiology

## 2021-04-26 NOTE — Progress Notes (Signed)
Cardiology Office Note   Date:  04/27/2021   ID:  Kathy [name], DOB Oct 24, 1951, MRN [id number]  PCP:  Shirline Frees, MD    No chief complaint on file.  Hyperlipidemia  Wt Readings from Last 3 Encounters:  04/27/21 166 lb 3.2 oz (75.4 kg)  02/24/20 169 lb 1.9 oz (76.7 kg)  02/21/20 169 lb (76.7 kg)       History of Present Illness: Kathy [name] is a 70 y.o. female  with a history of high cholesterol who has been intolerant of Zetia, Lipitor, Zocor. Crestor alone was tolerated but in combination with Zetia, she had severe stomach upset, vomiting. She has been managed in the lipid clinic.   She has been on Lovaza and Crestor 10 mg.  With 20 mg of Crestor, she had nausea.  She had nausea with Zetia.     Lipids in April 2021 showed a significant increase in her total cholesterol.  Exercise had dropped off in 2020.     Palpitations in 08/2019 prompted trip to ER.    Since the last visit, she continues to walk.   She eats a plant based diet for the most part.      Past Medical History:  Diagnosis Date   CKD (chronic kidney disease), stage III ([place])    Rosacea    Thyroid disease     Past Surgical History:  Procedure Laterality Date   BTL     PILONIDAL CYST / SINUS EXCISION     THYROID SURGERY  2008   partial thyroidectomy     Current Outpatient Medications  Medication Sig Dispense Refill   ALPRAZolam (XANAX) 0.25 MG tablet Take 0.25 mg by mouth every 4 (four) hours as needed for anxiety.     aspirin EC 81 MG tablet Take 1 tablet (81 mg total) by mouth daily. 90 tablet 3   Biotin 1000 MCG tablet Take 1,000 mcg by mouth daily.      Coenzyme Q10 (CO Q 10) 100 MG CAPS Take 100 mg by mouth daily.      hydrOXYzine (ATARAX/VISTARIL) 10 MG tablet Take 10 mg by mouth daily as needed.     levothyroxine (SYNTHROID, LEVOTHROID) 88 MCG tablet Take 88 mcg by mouth daily.     Multiple Vitamins-Minerals (MULTIVITAMINS THER. W/MINERALS) TABS Take 1 tablet by mouth daily.      Multiple Vitamins-Minerals (ZINC PO) Take 1 tablet by mouth daily.     omega-3 acid ethyl esters (LOVAZA) 1 g capsule TAKE 2 CAPSULES TWICE DAILY 360 capsule 3   rosuvastatin (CRESTOR) 10 MG tablet TAKE 1 TABLET DAILY 90 tablet 3   tretinoin (RETIN-A) 0.05 % cream APPLY TO AFFECTED AREA ON THE SKIN AT BEDTIME.     Vitamin D, Cholecalciferol, 1000 units CAPS Take 1,000 Units by mouth daily.     zinc gluconate 50 MG tablet Take 50 mg by mouth daily.     No current facility-administered medications for this visit.    Allergies:   Lipitor [atorvastatin], Zetia [ezetimibe], and Zocor [simvastatin]    Social History:  The patient  reports that she has never smoked. She has never used smokeless tobacco. She reports current alcohol use. She reports that she does not use drugs.   Family History:  The patient's family history includes AAA (abdominal aortic aneurysm) in her father; Diabetes Mellitus I in her mother; Heart failure in an other family member; Hypercholesterolemia in her father and mother; Prostate cancer in her father; Stroke in  an other family member.    ROS:  Please see the history of present illness.   Otherwise, review of systems are positive for rare dietary indiscretion.   All other systems are reviewed and negative.    PHYSICAL EXAM: VS:  BP 130/70   Pulse 66   Ht 5\' 5"  (1.651 m)   Wt 166 lb 3.2 oz (75.4 kg)   SpO2 95%   BMI 27.66 kg/m  , BMI Body mass index is 27.66 kg/m. GEN: Well nourished, well developed, in no acute distress HEENT: normal Neck: no JVD, carotid bruits, or masses Cardiac: RRR; no murmurs, rubs, or gallops,no edema  Respiratory:  clear to auscultation bilaterally, normal work of breathing GI: soft, nontender, nondistended, + BS MS: no deformity or atrophy Skin: warm and dry, no rash Neuro:  Strength and sensation are intact Psych: euthymic mood, full affect   EKG:   The ekg ordered today demonstrates NSR, no ST changes   Recent  Labs: 04/13/2021: ALT 19; BUN 8; Creatinine, Ser 0.86; Potassium 4.4; Sodium 139   Lipid Panel    Component Value Date/Time   CHOL 185 04/13/2021 0957   CHOL 216 (H) 09/03/2016 0754   CHOL 214 (H) 03/08/2015 0755   TRIG 129 04/13/2021 0957   TRIG 181 (H) 09/03/2016 0754   TRIG 148 03/08/2015 0755   HDL 73 04/13/2021 0957   HDL 66 09/03/2016 0754   HDL 70 03/08/2015 0755   CHOLHDL 2.5 04/13/2021 0957   CHOLHDL 3.3 09/03/2016 0754   LDLCALC 90 04/13/2021 0957   LDLCALC 120 (H) 09/03/2016 0754   LDLCALC 114 (H) 03/08/2015 0755     Other studies Reviewed: Additional studies/ records that were reviewed today with results demonstrating: labs reviewed.   ASSESSMENT AND PLAN:  Hyperlipidemia: The current medical regimen is effective;  continue present plan and medications.  LDL 86 in 6/22. Elevated LFTs: Improved in Jun 2022.  PreDM: Whole food, plant-based diet.  Regular exercise recommended as well.  She has tried to exercise regularly and improved her diet. Hypothyroid: TSH stable in 2021.  To be checked with primary care doctor.  Current medicines are reviewed at length with the patient today.  The patient concerns regarding her medicines were addressed.  The following changes have been made:  No change  Labs/ tests ordered today include:   Orders Placed This Encounter  Procedures   EKG 12-Lead    Recommend 150 minutes/week of aerobic exercise Low fat, low carb, high fiber diet recommended  Disposition:   FU in 1 year   Signed, Larae Grooms, MD  04/27/2021 10:15 AM    [facility] [place], [place], [place]  [address] Phone: [phone]; Fax: [phone]

## 2021-04-27 ENCOUNTER — Other Ambulatory Visit: Payer: Self-pay

## 2021-04-27 ENCOUNTER — Ambulatory Visit (INDEPENDENT_AMBULATORY_CARE_PROVIDER_SITE_OTHER): Payer: Medicare Other | Admitting: Interventional Cardiology

## 2021-04-27 ENCOUNTER — Encounter: Payer: Self-pay | Admitting: Interventional Cardiology

## 2021-04-27 VITALS — BP 130/70 | HR 66 | Ht 65.0 in | Wt 166.2 lb

## 2021-04-27 DIAGNOSIS — R7303 Prediabetes: Secondary | ICD-10-CM | POA: Diagnosis not present

## 2021-04-27 DIAGNOSIS — R7989 Other specified abnormal findings of blood chemistry: Secondary | ICD-10-CM | POA: Diagnosis not present

## 2021-04-27 DIAGNOSIS — E7800 Pure hypercholesterolemia, unspecified: Secondary | ICD-10-CM

## 2021-04-27 DIAGNOSIS — E039 Hypothyroidism, unspecified: Secondary | ICD-10-CM

## 2021-04-27 NOTE — Patient Instructions (Signed)

## 2021-05-02 DIAGNOSIS — Z0000 Encounter for general adult medical examination without abnormal findings: Secondary | ICD-10-CM | POA: Diagnosis not present

## 2021-05-02 DIAGNOSIS — E559 Vitamin D deficiency, unspecified: Secondary | ICD-10-CM | POA: Diagnosis not present

## 2021-05-02 DIAGNOSIS — R7309 Other abnormal glucose: Secondary | ICD-10-CM | POA: Diagnosis not present

## 2021-05-02 DIAGNOSIS — E039 Hypothyroidism, unspecified: Secondary | ICD-10-CM | POA: Diagnosis not present

## 2021-05-02 DIAGNOSIS — E7800 Pure hypercholesterolemia, unspecified: Secondary | ICD-10-CM | POA: Diagnosis not present

## 2021-06-11 ENCOUNTER — Encounter: Payer: Self-pay | Admitting: Nurse Practitioner

## 2021-06-11 ENCOUNTER — Other Ambulatory Visit: Payer: Self-pay

## 2021-06-11 ENCOUNTER — Ambulatory Visit (INDEPENDENT_AMBULATORY_CARE_PROVIDER_SITE_OTHER): Payer: Medicare Other | Admitting: Nurse Practitioner

## 2021-06-11 VITALS — BP 120/78 | HR 83 | Ht 65.0 in | Wt 170.2 lb

## 2021-06-11 DIAGNOSIS — Z01419 Encounter for gynecological examination (general) (routine) without abnormal findings: Secondary | ICD-10-CM

## 2021-06-11 DIAGNOSIS — Z780 Asymptomatic menopausal state: Secondary | ICD-10-CM

## 2021-06-11 DIAGNOSIS — M8589 Other specified disorders of bone density and structure, multiple sites: Secondary | ICD-10-CM

## 2021-06-11 NOTE — Progress Notes (Signed)
   Kathy [name] 01/31/1951 [id number]   History:  70 y.o. G2P0002 presents for breast and pelvic exam. No GYN complaints. Postmenopausal - no HRT, no bleeding. Normal pap and mammogram history. HLD, osteopenia, hypothyroidism managed by PCP.   Gynecologic History No LMP recorded. Patient is postmenopausal.   Contraception: post menopausal status  Health Maintenance Last Pap: No longer screening per guidelines Last mammogram: 03/02/2021. Results were: Normal after follow up ultrasound of left breast 03/2021 Last colonoscopy: 2013 Last Dexa: 07/12/2020. Results were: T-score -2.1, FRAX 19.6% / 4.1%  Past medical history, past surgical history, family history and social history were all reviewed and documented in the EPIC chart. Married. 3 children - 2 here, 1 in [place]. 7 grandchildren.   ROS:  A ROS was performed and pertinent positives and negatives are included.  Exam:  Vitals:   06/11/21 1331  BP: 120/78  Pulse: 83  SpO2: 97%  Weight: 170 lb 3.2 oz (77.2 kg)  Height: '5\' 5"'$  (1.651 m)   Body mass index is 28.32 kg/m.  General appearance:  Normal Thyroid:  Symmetrical, normal in size, without palpable masses or nodularity. Respiratory  Auscultation:  Clear without wheezing or rhonchi Cardiovascular  Auscultation:  Regular rate, without rubs, murmurs or gallops  Edema/varicosities:  Not grossly evident Abdominal  Soft,nontender, without masses, guarding or rebound.  Liver/spleen:  No organomegaly noted  Hernia:  None appreciated  Skin  Inspection:  Grossly normal Breasts: Examined lying and sitting.   Right: Without masses, retractions, nipple discharge or axillary adenopathy.   Left: Without masses, retractions, nipple discharge or axillary adenopathy. Genitourinary   Inguinal/mons:  Normal without inguinal adenopathy  External genitalia:  Normal appearing vulva with no masses, tenderness, or lesions  BUS/Urethra/Skene's glands:  Normal  Vagina:  Normal appearing with  normal color and discharge, no lesions. Atrophic changes  Cervix:  Atrophic changes  Uterus:  Normal in size, shape and contour.  Midline and mobile, nontender  Adnexa/parametria:     Rt: Normal in size, without masses or tenderness.   Lt: Normal in size, without masses or tenderness.  Anus and perineum: Normal  Digital rectal exam: Normal sphincter tone without palpated masses or tenderness  Patient informed chaperone available to be present for breast and pelvic exam. Patient has requested no chaperone to be present. Patient has been advised what will be completed during breast and pelvic exam.   Assessment/Plan:  70 y.o. G2P0002 for breast and pelvic exam.   Well female exam with routine gynecological exam - Education provided on SBEs, importance of preventative screenings, current guidelines, high calcium diet, regular exercise, and multivitamin daily. Labs with PCP.   Postmenopausal - no HRT, no bleeding.   Osteopenia of multiple sites - Dexa 07/2020 T-score -2.1. PCP manages this. Taking daily Vitamin D supplement.   Screening for cervical cancer - Normal Pap history. No longer screening per guidelines.  Screening for breast cancer - Normal mammogram history.  Continue annual screenings.  Normal breast exam today.  Screening for colon cancer - 2013 colonoscopy. Will repeat at GI's recommended interval.   Return in 2 years for breast and pelvic exam.   Tamela Gammon DNP, 1:45 PM 06/11/2021

## 2021-08-07 DIAGNOSIS — D225 Melanocytic nevi of trunk: Secondary | ICD-10-CM | POA: Diagnosis not present

## 2021-08-07 DIAGNOSIS — L821 Other seborrheic keratosis: Secondary | ICD-10-CM | POA: Diagnosis not present

## 2021-08-07 DIAGNOSIS — L578 Other skin changes due to chronic exposure to nonionizing radiation: Secondary | ICD-10-CM | POA: Diagnosis not present

## 2021-08-07 DIAGNOSIS — L814 Other melanin hyperpigmentation: Secondary | ICD-10-CM | POA: Diagnosis not present

## 2021-08-07 DIAGNOSIS — L718 Other rosacea: Secondary | ICD-10-CM | POA: Diagnosis not present

## 2021-08-07 DIAGNOSIS — Z09 Encounter for follow-up examination after completed treatment for conditions other than malignant neoplasm: Secondary | ICD-10-CM | POA: Diagnosis not present

## 2021-08-08 DIAGNOSIS — H5203 Hypermetropia, bilateral: Secondary | ICD-10-CM | POA: Diagnosis not present

## 2021-08-08 DIAGNOSIS — H52223 Regular astigmatism, bilateral: Secondary | ICD-10-CM | POA: Diagnosis not present

## 2021-08-08 DIAGNOSIS — H2513 Age-related nuclear cataract, bilateral: Secondary | ICD-10-CM | POA: Diagnosis not present

## 2021-08-08 DIAGNOSIS — H524 Presbyopia: Secondary | ICD-10-CM | POA: Diagnosis not present

## 2021-10-22 DIAGNOSIS — L718 Other rosacea: Secondary | ICD-10-CM | POA: Diagnosis not present

## 2022-01-07 ENCOUNTER — Ambulatory Visit: Payer: Medicare Other | Admitting: Nurse Practitioner

## 2022-01-08 ENCOUNTER — Ambulatory Visit (INDEPENDENT_AMBULATORY_CARE_PROVIDER_SITE_OTHER): Payer: Medicare Other | Admitting: Nurse Practitioner

## 2022-01-08 ENCOUNTER — Other Ambulatory Visit: Payer: Self-pay

## 2022-01-08 VITALS — BP 120/76

## 2022-01-08 DIAGNOSIS — R1030 Lower abdominal pain, unspecified: Secondary | ICD-10-CM | POA: Diagnosis not present

## 2022-01-08 DIAGNOSIS — N898 Other specified noninflammatory disorders of vagina: Secondary | ICD-10-CM | POA: Diagnosis not present

## 2022-01-08 DIAGNOSIS — R350 Frequency of micturition: Secondary | ICD-10-CM

## 2022-01-08 LAB — URINALYSIS, COMPLETE W/RFL CULTURE
Bacteria, UA: NONE SEEN /HPF
Bilirubin Urine: NEGATIVE
Casts: NONE SEEN /LPF
Crystals: NONE SEEN /HPF
Glucose, UA: NEGATIVE
Hgb urine dipstick: NEGATIVE
Hyaline Cast: NONE SEEN /LPF
Ketones, ur: NEGATIVE
Leukocyte Esterase: NEGATIVE
Nitrites, Initial: NEGATIVE
Protein, ur: NEGATIVE
RBC / HPF: NONE SEEN /HPF (ref 0–2)
Specific Gravity, Urine: 1.015 (ref 1.001–1.035)
WBC, UA: NONE SEEN /HPF (ref 0–5)
Yeast: NONE SEEN /HPF
pH: 7.5 (ref 5.0–8.0)

## 2022-01-08 LAB — NO CULTURE INDICATED

## 2022-01-08 LAB — WET PREP FOR TRICH, YEAST, CLUE

## 2022-01-08 NOTE — Progress Notes (Signed)
° °  Acute Office Visit  Subjective:    Patient ID: Kathy [name], female    DOB: 1951-03-07, 71 y.o.   MRN: [id number]   HPI 71 y.o. presents today for urinary frequency, lower abdominal pain, and vaginal itching and irritation. Pain started 3 weeks ago after lifting up a car trunk and feeling like a strain. Initially the pain was along the lower abdomen and is now mostly lower, mid abdomen. Pain has improved and she describes it as sore. The frequency and urgency started within the last week. She also feels very dry. Denies changes in bowel habits but does remember eating more nuts than normal and pain started around that time. No history of diverticulosis/itis. Normal colonoscopy 5 years ago per patient. Not sexually active.    Review of Systems  Constitutional: Negative.   Gastrointestinal:  Positive for abdominal pain (Lower).  Genitourinary:  Positive for frequency, urgency and vaginal pain (Itching/irritation). Negative for difficulty urinating, flank pain, hematuria, vaginal bleeding and vaginal discharge.      Objective:    Physical Exam Constitutional:      Appearance: Normal appearance.  Abdominal:     Tenderness: There is no abdominal tenderness. There is no right CVA tenderness, left CVA tenderness, guarding or rebound.  Genitourinary:    General: Normal vulva.     Vagina: Normal.     Cervix: Normal.     Uterus: Normal.      Adnexa: Right adnexa normal and left adnexa normal.     Comments: Atrophic changes   BP 120/76  Wt Readings from Last 3 Encounters:  06/11/21 170 lb 3.2 oz (77.2 kg)  04/27/21 166 lb 3.2 oz (75.4 kg)  02/24/20 169 lb 1.9 oz (76.7 kg)   Wet prep negative UA negative     Patient informed chaperone available to be present for breast and pelvic exam. Patient has requested no chaperone to be present. Patient has been advised what will be completed during breast and pelvic exam.   Assessment & Plan:   Problem List Items Addressed This Visit    None Visit Diagnoses     Lower abdominal pain    -  Primary   Frequent urination       Relevant Orders   Urinalysis,Complete w/RFL Culture   Vaginal irritation       Relevant Orders   WET PREP FOR [place], YEAST, CLUE      Plan: Normal wet prep, UA, and pelvic exam. Symptoms are improving. Recommend following up with PCP or GI if symptoms continue or worsen. She is agreeable to plan.      Tamela Gammon DNP, 2:56 PM 01/08/2022

## 2022-02-19 ENCOUNTER — Other Ambulatory Visit: Payer: Self-pay | Admitting: Family Medicine

## 2022-02-19 DIAGNOSIS — Z1231 Encounter for screening mammogram for malignant neoplasm of breast: Secondary | ICD-10-CM

## 2022-03-12 DIAGNOSIS — M5412 Radiculopathy, cervical region: Secondary | ICD-10-CM | POA: Diagnosis not present

## 2022-04-11 ENCOUNTER — Other Ambulatory Visit: Payer: Self-pay | Admitting: Interventional Cardiology

## 2022-04-15 ENCOUNTER — Ambulatory Visit: Payer: Medicare Other

## 2022-04-15 ENCOUNTER — Ambulatory Visit
Admission: RE | Admit: 2022-04-15 | Discharge: 2022-04-15 | Disposition: A | Discharge status: Home / Self Care | Payer: Medicare Other | Source: Ambulatory Visit | Attending: Family Medicine | Admitting: Family Medicine

## 2022-04-15 DIAGNOSIS — Z1231 Encounter for screening mammogram for malignant neoplasm of breast: Secondary | ICD-10-CM | POA: Diagnosis not present

## 2022-04-23 ENCOUNTER — Ambulatory Visit: Payer: Medicare Other | Admitting: Interventional Cardiology

## 2022-04-23 ENCOUNTER — Other Ambulatory Visit: Payer: Self-pay

## 2022-04-23 ENCOUNTER — Other Ambulatory Visit: Payer: Medicare Other

## 2022-04-23 DIAGNOSIS — E7800 Pure hypercholesterolemia, unspecified: Secondary | ICD-10-CM

## 2022-04-23 DIAGNOSIS — R7989 Other specified abnormal findings of blood chemistry: Secondary | ICD-10-CM

## 2022-04-23 LAB — COMPREHENSIVE METABOLIC PANEL
ALT: 29 IU/L (ref 0–32)
AST: 37 IU/L (ref 0–40)
Albumin/Globulin Ratio: 2.1 (ref 1.2–2.2)
Albumin: 4.4 g/dL (ref 3.7–4.7)
Alkaline Phosphatase: 93 IU/L (ref 44–121)
BUN/Creatinine Ratio: 9 — ABNORMAL LOW (ref 12–28)
BUN: 8 mg/dL (ref 8–27)
Bilirubin Total: 0.5 mg/dL (ref 0.0–1.2)
CO2: 23 mmol/L (ref 20–29)
Calcium: 9.4 mg/dL (ref 8.7–10.3)
Chloride: 101 mmol/L (ref 96–106)
Creatinine, Ser: 0.88 mg/dL (ref 0.57–1.00)
Globulin, Total: 2.1 g/dL (ref 1.5–4.5)
Glucose: 111 mg/dL — ABNORMAL HIGH (ref 70–99)
Potassium: 4.3 mmol/L (ref 3.5–5.2)
Sodium: 139 mmol/L (ref 134–144)
Total Protein: 6.5 g/dL (ref 6.0–8.5)
eGFR: 70 mL/min/{1.73_m2} (ref >59)

## 2022-04-23 LAB — LIPID PANEL
Chol/HDL Ratio: 2.8 ratio (ref 0.0–4.4)
Cholesterol, Total: 196 mg/dL (ref 100–199)
HDL: 69 mg/dL (ref >39)
LDL Chol Calc (NIH): 102 mg/dL — ABNORMAL HIGH (ref 0–99)
Triglycerides: 147 mg/dL (ref 0–149)
VLDL Cholesterol Cal: 25 mg/dL (ref 5–40)

## 2022-04-23 NOTE — Progress Notes (Signed)
Pt walked into lab today for lab work.  No orders present has an OV with Dr. Irish Lack on 04/30/22.  Last yr pt had FLP and CMP prior to OV.  Placed order for these labs to be drawn.  Pt reports is fasting today.

## 2022-04-29 NOTE — Progress Notes (Signed)
Cardiology Office Note   Date:  04/30/2022   ID:  Kathy [name], Kathy [name] 22-Aug-1951, MRN [id number]  PCP:  Shirline Frees, MD    No chief complaint on file.  hyperlipidemia  Wt Readings from Last 3 Encounters:  04/30/22 168 lb (76.2 kg)  06/11/21 170 lb 3.2 oz (77.2 kg)  04/27/21 166 lb 3.2 oz (75.4 kg)       History of Present Illness: Kathy [name] is a 71 y.o. female  with a history of high cholesterol who has been intolerant of Zetia, Lipitor, Zocor. Crestor alone was tolerated but in combination with Zetia, she had severe stomach upset, vomiting. She has been managed in the lipid clinic.   She has been on Lovaza and Crestor 10 mg.  With 20 mg of Crestor, she had nausea.  She had nausea with Zetia.     Lipids in April 2021 showed a significant increase in her total cholesterol.  Exercise had dropped off in 2020.     Palpitations in 08/2019 prompted trip to ER.    In the past, it was noted that she walks regularly and she eats a plant based diet for the most part.    Since 2022, her exercise dropped off.  She wants to exercise more for 6 months.  If her labs don't improve, she would double her rosuvastatin.   Nausea with Crestor 20 mg daily in the past.She would try 10 mg twice a day if needed.  Denies : Chest pain. Dizziness. Leg edema. Nitroglycerin use. Orthopnea. Palpitations. Paroxysmal nocturnal dyspnea. Shortness of breath. Syncope.      Past Medical History:  Diagnosis Date   CKD (chronic kidney disease), stage III ([place])    Rosacea    Thyroid disease     Past Surgical History:  Procedure Laterality Date   BTL     PILONIDAL CYST / SINUS EXCISION     THYROID SURGERY  2008   partial thyroidectomy     Current Outpatient Medications  Medication Sig Dispense Refill   ALPRAZolam (XANAX) 0.25 MG tablet Take 0.25 mg by mouth every 4 (four) hours as needed for anxiety.     aspirin EC 81 MG tablet Take 1 tablet (81 mg total) by mouth daily. 90 tablet 3    Biotin 1000 MCG tablet Take 1,000 mcg by mouth daily.      Coenzyme Q10 (CO Q 10) 100 MG CAPS Take 100 mg by mouth daily.      levothyroxine (SYNTHROID, LEVOTHROID) 88 MCG tablet Take 88 mcg by mouth daily.     omega-3 acid ethyl esters (LOVAZA) 1 g capsule TAKE 2 CAPSULES TWICE DAILY 360 capsule 0   rosuvastatin (CRESTOR) 10 MG tablet TAKE 1 TABLET DAILY 90 tablet 3   tretinoin (RETIN-A) 0.05 % cream APPLY TO AFFECTED AREA ON THE SKIN AT BEDTIME.     Vitamin D, Cholecalciferol, 1000 units CAPS Take 1,000 Units by mouth daily.     No current facility-administered medications for this visit.    Allergies:   Lipitor [atorvastatin], Zetia [ezetimibe], and Zocor [simvastatin]    Social History:  The patient  reports that she has never smoked. She has never used smokeless tobacco. She reports current alcohol use. She reports that she does not use drugs.   Family History:  The patient's family history includes AAA (abdominal aortic aneurysm) in her father; Diabetes Mellitus I in her mother; Heart failure in an other family member; Hypercholesterolemia in her father and mother;  Prostate cancer in her father; Stroke in an other family member.    ROS:  Please see the history of present illness.   Otherwise, review of systems are positive for decreased exercise.   All other systems are reviewed and negative.    PHYSICAL EXAM: VS:  BP 130/70 (BP Location: Left Arm, Patient Position: Sitting, Cuff Size: Normal)   Pulse 70   Ht '5\' 5"'$  (1.651 m)   Wt 168 lb (76.2 kg)   BMI 27.96 kg/m  , BMI Body mass index is 27.96 kg/m. GEN: Well nourished, well developed, in no acute distress HEENT: normal Neck: no JVD, carotid bruits, or masses Cardiac: Regular.RRR; no murmurs, rubs, or gallops,no edema  Respiratory:  clear to auscultation bilaterally, normal work of breathing GI: soft, nontender, nondistended, + BS MS: no deformity or atrophy Skin: warm and dry, no rash Neuro:  Strength and sensation  are intact Psych: euthymic mood, full affect   EKG:   The ekg ordered today demonstrates NSR, PRWP   Recent Labs: 04/23/2022: ALT 29; BUN 8; Creatinine, Ser 0.88; Potassium 4.3; Sodium 139   Lipid Panel    Component Value Date/Time   CHOL 196 04/23/2022 0943   CHOL 216 (H) 09/03/2016 0754   CHOL 214 (H) 03/08/2015 0755   TRIG 147 04/23/2022 0943   TRIG 181 (H) 09/03/2016 0754   TRIG 148 03/08/2015 0755   HDL 69 04/23/2022 0943   HDL 66 09/03/2016 0754   HDL 70 03/08/2015 0755   CHOLHDL 2.8 04/23/2022 0943   CHOLHDL 3.3 09/03/2016 0754   LDLCALC 102 (H) 04/23/2022 0943   LDLCALC 120 (H) 09/03/2016 0754   LDLCALC 114 (H) 03/08/2015 0755     Other studies Reviewed: Additional studies/ records that were reviewed today with results demonstrating: .   ASSESSMENT AND PLAN:  Hyperlipidemia: Plan for calcium score CT scan. Repeat lipids in 6 months.  If lipids do not improve, she would be willing to try Crestor 10 mg twice daily.  If her calcium score is elevated, she may be a candidate for nonstatin lipid-lowering therapies. Elevated LFTs: AST and ALT were slightly elevated in 2019.  Resolved PreDM: Whole food, plant-based diet.  High-fiber diet.  Avoid processed foods. Hypothyroid: stable per her report.   Current medicines are reviewed at length with the patient today.  The patient concerns regarding her medicines were addressed.  The following changes have been made:  No change  Labs/ tests ordered today include:  No orders of the defined types were placed in this encounter.   Recommend 150 minutes/week of aerobic exercise Low fat, low carb, high fiber diet recommended  Disposition:   FU in 1 year   Signed, Larae Grooms, MD  04/30/2022 9:28 AM    [facility] [address], [place], [place]  [address] Phone: [phone]; Fax: [phone]

## 2022-04-30 ENCOUNTER — Ambulatory Visit (INDEPENDENT_AMBULATORY_CARE_PROVIDER_SITE_OTHER): Payer: Medicare Other | Admitting: Interventional Cardiology

## 2022-04-30 ENCOUNTER — Encounter: Payer: Self-pay | Admitting: Interventional Cardiology

## 2022-04-30 VITALS — BP 130/70 | HR 70 | Ht 65.0 in | Wt 168.0 lb

## 2022-04-30 DIAGNOSIS — E039 Hypothyroidism, unspecified: Secondary | ICD-10-CM

## 2022-04-30 DIAGNOSIS — Z7189 Other specified counseling: Secondary | ICD-10-CM | POA: Diagnosis not present

## 2022-04-30 DIAGNOSIS — E7800 Pure hypercholesterolemia, unspecified: Secondary | ICD-10-CM

## 2022-04-30 DIAGNOSIS — R7303 Prediabetes: Secondary | ICD-10-CM

## 2022-04-30 DIAGNOSIS — R7989 Other specified abnormal findings of blood chemistry: Secondary | ICD-10-CM

## 2022-04-30 NOTE — Patient Instructions (Signed)
Medication Instructions:  Your physician recommends that you continue on your current medications as directed. Please refer to the Current Medication list given to you today.  *If you need a refill on your cardiac medications before your next appointment, please call your pharmacy*   Lab Work: Your physician recommends that you return for lab work on October 30, 2022.  Lipid and liver profiles.  This will be fasting.  The lab opens at 7:15 AM  If you have labs (blood work) drawn today and your tests are completely normal, you will receive your results only by: [place] (if you have MyChart) OR A paper copy in the mail If you have any lab test that is abnormal or we need to change your treatment, we will call you to review the results.   Testing/Procedures: Dr Irish Lack recommends you have a Calcium Score CT Scan. To be done at [facility] in [place]: At [facility], you and your health needs are our priority.  As part of our continuing mission to provide you with exceptional heart care, we have created designated Provider Care Teams.  These Care Teams include your primary Cardiologist (physician) and Advanced Practice Providers (APPs -  Physician Assistants and Nurse Practitioners) who all work together to provide you with the care you need, when you need it.  We recommend signing up for the patient portal called "MyChart".  Sign up information is provided on this After Visit Summary.  MyChart is used to connect with patients for Virtual Visits (Telemedicine).  Patients are able to view lab/test results, encounter notes, upcoming appointments, etc.  Non-urgent messages can be sent to your provider as well.   To learn more about what you can do with MyChart, go to [email or web address].    Your next appointment:   12 month(s)  The format for your next appointment:   In Person  Provider:   Larae Grooms, MD     Other Instructions     Important Information About Sugar

## 2022-05-02 ENCOUNTER — Encounter: Payer: Self-pay | Admitting: Interventional Cardiology

## 2022-05-03 DIAGNOSIS — E559 Vitamin D deficiency, unspecified: Secondary | ICD-10-CM | POA: Diagnosis not present

## 2022-05-03 DIAGNOSIS — E039 Hypothyroidism, unspecified: Secondary | ICD-10-CM | POA: Diagnosis not present

## 2022-05-03 DIAGNOSIS — M85852 Other specified disorders of bone density and structure, left thigh: Secondary | ICD-10-CM | POA: Diagnosis not present

## 2022-05-03 DIAGNOSIS — R7303 Prediabetes: Secondary | ICD-10-CM | POA: Diagnosis not present

## 2022-05-03 DIAGNOSIS — E7800 Pure hypercholesterolemia, unspecified: Secondary | ICD-10-CM | POA: Diagnosis not present

## 2022-05-03 DIAGNOSIS — Z0000 Encounter for general adult medical examination without abnormal findings: Secondary | ICD-10-CM | POA: Diagnosis not present

## 2022-05-08 ENCOUNTER — Other Ambulatory Visit: Payer: Self-pay | Admitting: Family Medicine

## 2022-05-08 DIAGNOSIS — M85852 Other specified disorders of bone density and structure, left thigh: Secondary | ICD-10-CM

## 2022-05-27 ENCOUNTER — Other Ambulatory Visit: Payer: Self-pay | Admitting: Family Medicine

## 2022-06-17 ENCOUNTER — Ambulatory Visit
Admission: RE | Admit: 2022-06-17 | Discharge: 2022-06-17 | Disposition: A | Discharge status: Home / Self Care | Payer: Medicare Other | Source: Ambulatory Visit | Attending: Interventional Cardiology | Admitting: Interventional Cardiology

## 2022-06-17 DIAGNOSIS — I700 Atherosclerosis of aorta: Secondary | ICD-10-CM | POA: Diagnosis not present

## 2022-06-17 DIAGNOSIS — E7800 Pure hypercholesterolemia, unspecified: Secondary | ICD-10-CM

## 2022-06-17 DIAGNOSIS — Z7189 Other specified counseling: Secondary | ICD-10-CM

## 2022-06-19 ENCOUNTER — Telehealth: Payer: Self-pay | Admitting: *Deleted

## 2022-06-19 DIAGNOSIS — E7800 Pure hypercholesterolemia, unspecified: Secondary | ICD-10-CM

## 2022-06-19 NOTE — Telephone Encounter (Signed)
-----   Message from Jettie Booze, MD sent at 06/19/2022 12:37 PM EDT ----- OK to refer to lipid clinic ----- Message ----- From: Ramond Dial, RPH-CPP Sent: 06/19/2022   7:12 AM EDT To: Jettie Booze, MD; #  Yes, she would be a great candidate for an alternative.  ----- Message ----- From: Jettie Booze, MD Sent: 06/18/2022   5:15 PM EDT To: Thompson Grayer, RN; #  Elevated calcium score.  Given difficulties with statins, will check with lipid clinic if she would be a candidate for nonstatin lipid lowering therapy.

## 2022-06-19 NOTE — Telephone Encounter (Signed)
I spoke with patient and reviewed Calcium Score CT results with her.  She is feeling fine and will let us know if she develops chest pain, shortness of breath or change in activity level.  She would like referral to lipid clinic.  Appointment scheduled for July 18, 2022 at 9:30.

## 2022-07-10 ENCOUNTER — Other Ambulatory Visit: Payer: Self-pay | Admitting: Interventional Cardiology

## 2022-07-17 NOTE — Progress Notes (Signed)
Patient ID: Kathy [name]                 DOB: 1951/08/19                    MRN: [id number]      HPI: Kathy [name] is a 71 y.o. female patient referred to lipid clinic by Dr. Irish Lack. PMH is significant for HLD, CKD and thyroid disease. Patient had a CAC of 1279 in Aug 2023.   Patient was previously on ezetimibe but had severe n/v. She did not tolerate Lipitor or simvastatin due to muscle aches. Currently on rosuvastatin '10mg'$  daily. Referred to lipid clinic after CAC score.  Patient presents today to lipid clinic. She has a few questions about her calcium score, a little nervous about it. States that she is doing well on rosuvastatin '10mg'$  daily since starting on CoQ10. She has improved her diet a lot per her report. Has cut back a lot on fat. She has Pharmacist, community through a retirement plan.   Current Medications: rosuvastatin '10mg'$  daily  Intolerances: of Zetia (nausea/vomiting), Lipitor, Zocor (muscle aches), lovaza Risk Factors: CAC 1279, CKD LDL goal: <70 ApoB goal: <80  Diet: cut back on dairy, fats  Exercise: walks 2 miles 3 days a week (30 min)  Family History: Family History  Problem Relation Age of Onset   Hypercholesterolemia Mother    Diabetes Mellitus I Mother    Prostate cancer Father    Hypercholesterolemia Father    AAA (abdominal aortic aneurysm) Father    Stroke Other    Heart failure Other    Breast cancer Neg Hx      Social History:   Labs: 04/23/22 TC 196, TG 147, HDL 69, LDL-C 102 (rosuvastatin '10mg'$  daily)  Past Medical History:  Diagnosis Date   CKD (chronic kidney disease), stage III (HCC)    Rosacea    Thyroid disease     Current Outpatient Medications on File Prior to Visit  Medication Sig Dispense Refill   ALPRAZolam (XANAX) 0.25 MG tablet Take 0.25 mg by mouth every 4 (four) hours as needed for anxiety.     aspirin EC 81 MG tablet Take 1 tablet (81 mg total) by mouth daily. 90 tablet 3   Biotin 1000 MCG tablet Take 1,000 mcg by  mouth daily.      Coenzyme Q10 (CO Q 10) 100 MG CAPS Take 100 mg by mouth daily.      levothyroxine (SYNTHROID, LEVOTHROID) 88 MCG tablet Take 88 mcg by mouth daily.     omega-3 acid ethyl esters (LOVAZA) 1 g capsule TAKE 2 CAPSULES TWICE DAILY 360 capsule 3   rosuvastatin (CRESTOR) 10 MG tablet TAKE 1 TABLET DAILY 90 tablet 3   tretinoin (RETIN-A) 0.05 % cream APPLY TO AFFECTED AREA ON THE SKIN AT BEDTIME.     Vitamin D, Cholecalciferol, 1000 units CAPS Take 1,000 Units by mouth daily.     No current facility-administered medications on file prior to visit.    Allergies  Allergen Reactions   Lipitor [Atorvastatin]     Severe muscle aches   Zetia [Ezetimibe]     GI upset   Zocor [Simvastatin]     Severe muscle aches    Assessment/Plan:  1. Hyperlipidemia - LDL-C is above goal of <70 on rosuvastatin '10mg'$  daily. Patient would like to try to increase dose of rosuvastatin before adding additional medication. We did discuss add on options including PCSK9i and Nexletol. Reviewed injection technique,  side effects, efficacy and cost of each. Will increase rosuvastatin to '40mg'$  daily. Labs already scheduled for Dec. I have encouraged patient to increase her walking to 5 days a week and to add in 2-3 days of strength training. We discussed the benefits of "functional strength training."  I reviewed patient's CAC score with her. I advised that without symptoms, there is no indication for any further testing. There are studies showing medical treatment is just as good as interventional. Patient reassured.  Will follow up with lab work in Dec and will decide at that time if additional therapy is needed. I will also add an ApoB.  Thank you,   Ramond Dial, Pharm.D, BCPS, CPP [facility]  [address], [place], [place] [address]  Phone: [phone]; Fax: [phone]

## 2022-07-18 ENCOUNTER — Ambulatory Visit: Payer: Medicare Other | Attending: Interventional Cardiology | Admitting: Pharmacist

## 2022-07-18 DIAGNOSIS — E7800 Pure hypercholesterolemia, unspecified: Secondary | ICD-10-CM | POA: Diagnosis not present

## 2022-07-18 MED ORDER — ROSUVASTATIN CALCIUM 40 MG PO TABS: 40.0000 mg | ORAL_TABLET | Freq: Every day | ORAL | 3 refills | Status: DC

## 2022-07-18 NOTE — Patient Instructions (Signed)
Please start taking rosuvastatin '40mg'$  daily  The two add on options we discussed were Repatha or Nexletol  Please increase your walking to 5 days a week and start with 2 days a week of strength exercises.  Please call me at [phone] with any questions

## 2022-09-02 DIAGNOSIS — Z6826 Body mass index (BMI) 26.0-26.9, adult: Secondary | ICD-10-CM | POA: Diagnosis not present

## 2022-09-02 DIAGNOSIS — E7800 Pure hypercholesterolemia, unspecified: Secondary | ICD-10-CM | POA: Diagnosis not present

## 2022-09-02 DIAGNOSIS — Z1331 Encounter for screening for depression: Secondary | ICD-10-CM | POA: Diagnosis not present

## 2022-09-02 DIAGNOSIS — R7303 Prediabetes: Secondary | ICD-10-CM | POA: Diagnosis not present

## 2022-09-02 DIAGNOSIS — F411 Generalized anxiety disorder: Secondary | ICD-10-CM | POA: Diagnosis not present

## 2022-09-02 DIAGNOSIS — R948 Abnormal results of function studies of other organs and systems: Secondary | ICD-10-CM | POA: Diagnosis not present

## 2022-09-02 DIAGNOSIS — R5383 Other fatigue: Secondary | ICD-10-CM | POA: Diagnosis not present

## 2022-09-02 DIAGNOSIS — E039 Hypothyroidism, unspecified: Secondary | ICD-10-CM | POA: Diagnosis not present

## 2022-09-02 DIAGNOSIS — E8889 Other specified metabolic disorders: Secondary | ICD-10-CM | POA: Diagnosis not present

## 2022-09-02 DIAGNOSIS — R0602 Shortness of breath: Secondary | ICD-10-CM | POA: Diagnosis not present

## 2022-09-02 DIAGNOSIS — N183 Chronic kidney disease, stage 3 unspecified: Secondary | ICD-10-CM | POA: Diagnosis not present

## 2022-09-02 DIAGNOSIS — E559 Vitamin D deficiency, unspecified: Secondary | ICD-10-CM | POA: Diagnosis not present

## 2022-09-16 DIAGNOSIS — Z6826 Body mass index (BMI) 26.0-26.9, adult: Secondary | ICD-10-CM | POA: Diagnosis not present

## 2022-09-16 DIAGNOSIS — N183 Chronic kidney disease, stage 3 unspecified: Secondary | ICD-10-CM | POA: Diagnosis not present

## 2022-09-16 DIAGNOSIS — R7303 Prediabetes: Secondary | ICD-10-CM | POA: Diagnosis not present

## 2022-09-16 DIAGNOSIS — E7800 Pure hypercholesterolemia, unspecified: Secondary | ICD-10-CM | POA: Diagnosis not present

## 2022-09-16 DIAGNOSIS — R948 Abnormal results of function studies of other organs and systems: Secondary | ICD-10-CM | POA: Diagnosis not present

## 2022-10-10 DIAGNOSIS — R7303 Prediabetes: Secondary | ICD-10-CM | POA: Diagnosis not present

## 2022-10-10 DIAGNOSIS — R948 Abnormal results of function studies of other organs and systems: Secondary | ICD-10-CM | POA: Diagnosis not present

## 2022-10-10 DIAGNOSIS — E7800 Pure hypercholesterolemia, unspecified: Secondary | ICD-10-CM | POA: Diagnosis not present

## 2022-10-10 DIAGNOSIS — Z6826 Body mass index (BMI) 26.0-26.9, adult: Secondary | ICD-10-CM | POA: Diagnosis not present

## 2022-10-10 DIAGNOSIS — N183 Chronic kidney disease, stage 3 unspecified: Secondary | ICD-10-CM | POA: Diagnosis not present

## 2022-10-10 DIAGNOSIS — E663 Overweight: Secondary | ICD-10-CM | POA: Diagnosis not present

## 2022-10-10 DIAGNOSIS — E559 Vitamin D deficiency, unspecified: Secondary | ICD-10-CM | POA: Diagnosis not present

## 2022-10-30 ENCOUNTER — Ambulatory Visit: Payer: Medicare Other | Attending: Interventional Cardiology

## 2022-10-30 DIAGNOSIS — R7303 Prediabetes: Secondary | ICD-10-CM | POA: Diagnosis not present

## 2022-10-30 DIAGNOSIS — E7800 Pure hypercholesterolemia, unspecified: Secondary | ICD-10-CM | POA: Diagnosis not present

## 2022-10-31 ENCOUNTER — Encounter: Payer: Self-pay | Admitting: Pharmacist

## 2022-10-31 ENCOUNTER — Telehealth: Payer: Self-pay | Admitting: Interventional Cardiology

## 2022-10-31 DIAGNOSIS — R7989 Other specified abnormal findings of blood chemistry: Secondary | ICD-10-CM

## 2022-10-31 LAB — HEPATIC FUNCTION PANEL
ALT: 50 IU/L — ABNORMAL HIGH (ref 0–32)
AST: 49 IU/L — ABNORMAL HIGH (ref 0–40)
Albumin: 4.5 g/dL (ref 3.8–4.8)
Alkaline Phosphatase: 83 IU/L (ref 44–121)
Bilirubin Total: 0.6 mg/dL (ref 0.0–1.2)
Bilirubin, Direct: 0.18 mg/dL (ref 0.00–0.40)
Total Protein: 6.9 g/dL (ref 6.0–8.5)

## 2022-10-31 LAB — LIPID PANEL
Chol/HDL Ratio: 2 ratio (ref 0.0–4.4)
Cholesterol, Total: 176 mg/dL (ref 100–199)
HDL: 90 mg/dL (ref >39)
LDL Chol Calc (NIH): 65 mg/dL (ref 0–99)
Triglycerides: 123 mg/dL (ref 0–149)
VLDL Cholesterol Cal: 21 mg/dL (ref 5–40)

## 2022-10-31 LAB — APOLIPOPROTEIN B: Apolipoprotein B: 69 mg/dL (ref <90)

## 2022-10-31 NOTE — Telephone Encounter (Signed)
I spoke with pt via mychart and scheduled/ordered labs

## 2022-10-31 NOTE — Telephone Encounter (Signed)
  Pt is calling, following up her mychart message to schedule her labs. No order on file yet

## 2022-11-14 ENCOUNTER — Ambulatory Visit
Admission: RE | Admit: 2022-11-14 | Discharge: 2022-11-14 | Disposition: A | Discharge status: Home / Self Care | Payer: Medicare Other | Source: Ambulatory Visit | Attending: Family Medicine | Admitting: Family Medicine

## 2022-11-14 DIAGNOSIS — Z780 Asymptomatic menopausal state: Secondary | ICD-10-CM | POA: Diagnosis not present

## 2022-11-14 DIAGNOSIS — M8589 Other specified disorders of bone density and structure, multiple sites: Secondary | ICD-10-CM | POA: Diagnosis not present

## 2022-11-14 DIAGNOSIS — M85852 Other specified disorders of bone density and structure, left thigh: Secondary | ICD-10-CM

## 2022-12-10 DIAGNOSIS — E663 Overweight: Secondary | ICD-10-CM | POA: Diagnosis not present

## 2022-12-10 DIAGNOSIS — R948 Abnormal results of function studies of other organs and systems: Secondary | ICD-10-CM | POA: Diagnosis not present

## 2022-12-10 DIAGNOSIS — R7303 Prediabetes: Secondary | ICD-10-CM | POA: Diagnosis not present

## 2022-12-10 DIAGNOSIS — N183 Chronic kidney disease, stage 3 unspecified: Secondary | ICD-10-CM | POA: Diagnosis not present

## 2022-12-10 DIAGNOSIS — E7800 Pure hypercholesterolemia, unspecified: Secondary | ICD-10-CM | POA: Diagnosis not present

## 2022-12-10 DIAGNOSIS — Z6826 Body mass index (BMI) 26.0-26.9, adult: Secondary | ICD-10-CM | POA: Diagnosis not present

## 2022-12-18 DIAGNOSIS — L814 Other melanin hyperpigmentation: Secondary | ICD-10-CM | POA: Diagnosis not present

## 2022-12-18 DIAGNOSIS — L298 Other pruritus: Secondary | ICD-10-CM | POA: Diagnosis not present

## 2022-12-18 DIAGNOSIS — L718 Other rosacea: Secondary | ICD-10-CM | POA: Diagnosis not present

## 2022-12-18 DIAGNOSIS — D485 Neoplasm of uncertain behavior of skin: Secondary | ICD-10-CM | POA: Diagnosis not present

## 2022-12-18 DIAGNOSIS — L538 Other specified erythematous conditions: Secondary | ICD-10-CM | POA: Diagnosis not present

## 2022-12-18 DIAGNOSIS — B351 Tinea unguium: Secondary | ICD-10-CM | POA: Diagnosis not present

## 2022-12-18 DIAGNOSIS — L821 Other seborrheic keratosis: Secondary | ICD-10-CM | POA: Diagnosis not present

## 2022-12-18 DIAGNOSIS — D225 Melanocytic nevi of trunk: Secondary | ICD-10-CM | POA: Diagnosis not present

## 2022-12-31 ENCOUNTER — Other Ambulatory Visit: Payer: Medicare Other

## 2023-01-29 ENCOUNTER — Ambulatory Visit: Payer: Medicare Other | Attending: Cardiology

## 2023-01-29 DIAGNOSIS — R7989 Other specified abnormal findings of blood chemistry: Secondary | ICD-10-CM

## 2023-01-30 LAB — HEPATIC FUNCTION PANEL
ALT: 50 IU/L — ABNORMAL HIGH (ref 0–32)
AST: 52 IU/L — ABNORMAL HIGH (ref 0–40)
Albumin: 4.4 g/dL (ref 3.8–4.8)
Alkaline Phosphatase: 100 IU/L (ref 44–121)
Bilirubin Total: 0.4 mg/dL (ref 0.0–1.2)
Bilirubin, Direct: 0.16 mg/dL (ref 0.00–0.40)
Total Protein: 6.6 g/dL (ref 6.0–8.5)

## 2023-01-30 MED ORDER — ROSUVASTATIN CALCIUM 20 MG PO TABS: 20.0000 mg | ORAL_TABLET | Freq: Every day | ORAL | 3 refills | Status: DC

## 2023-01-30 NOTE — Addendum Note (Signed)
Addended by: Marcelle Overlie D on: 01/30/2023 08:11 AM   Modules accepted: Orders

## 2023-01-31 ENCOUNTER — Telehealth: Payer: Self-pay | Admitting: Interventional Cardiology

## 2023-01-31 DIAGNOSIS — Z79899 Other long term (current) drug therapy: Secondary | ICD-10-CM

## 2023-01-31 DIAGNOSIS — E7800 Pure hypercholesterolemia, unspecified: Secondary | ICD-10-CM

## 2023-01-31 NOTE — Telephone Encounter (Signed)
Returned call to patient. She states she has read MyChart message regarding her lab work:   Jerline Pain, MD 01/30/2023  1:25 PM EDT Back to Top    Agree with plan Decreasing Crestor to 20mg  once a day Candee Furbish, MD       Ramond Dial, RPH-CPP 01/30/2023  8:10 AM EDT     LFT stable, ok to continue rosuvastatin. Will try decreasing to 20mg  daily.    Patient verbalized understanding and states she is agreeable to this plan.  Patient asked about follow-up labs. Has appt with Dr. Irish Lack in August. May get labs at hat time, but will reach out to see what Pharm D recommends.  Patient expressed appreciation for follow-up.

## 2023-01-31 NOTE — Telephone Encounter (Signed)
Patient is returning call. Please advise? 

## 2023-02-04 NOTE — Telephone Encounter (Signed)
Spoke with patient and discussed recommendation for follow-up labs.  Per Marcelle Overlie, RPH-CPP: Can we have her come in May or June for labs?    Lipid and liver panel ordered, scheduled for 04/08/23.  Patient verbalized understanding.

## 2023-04-08 ENCOUNTER — Other Ambulatory Visit: Payer: Medicare Other

## 2023-04-15 ENCOUNTER — Ambulatory Visit: Payer: Medicare Other | Attending: Interventional Cardiology

## 2023-04-15 DIAGNOSIS — E7800 Pure hypercholesterolemia, unspecified: Secondary | ICD-10-CM

## 2023-04-15 DIAGNOSIS — Z79899 Other long term (current) drug therapy: Secondary | ICD-10-CM

## 2023-04-15 LAB — HEPATIC FUNCTION PANEL
ALT: 26 IU/L (ref 0–32)
AST: 39 IU/L (ref 0–40)
Albumin: 4.2 g/dL (ref 3.8–4.8)
Alkaline Phosphatase: 94 IU/L (ref 44–121)
Bilirubin Total: 0.5 mg/dL (ref 0.0–1.2)
Bilirubin, Direct: 0.13 mg/dL (ref 0.00–0.40)
Total Protein: 6.6 g/dL (ref 6.0–8.5)

## 2023-04-15 LAB — LIPID PANEL
Chol/HDL Ratio: 2.5 ratio (ref 0.0–4.4)
Cholesterol, Total: 198 mg/dL (ref 100–199)
HDL: 78 mg/dL (ref >39)
LDL Chol Calc (NIH): 98 mg/dL (ref 0–99)
Triglycerides: 125 mg/dL (ref 0–149)
VLDL Cholesterol Cal: 22 mg/dL (ref 5–40)

## 2023-04-16 ENCOUNTER — Telehealth: Payer: Self-pay | Admitting: Pharmacist

## 2023-04-16 NOTE — Telephone Encounter (Signed)
Called pt to discuss lipid panel results. LDL has increased from 65 to 98 since decreasing rosuvastatin from 40mg  to 20mg  daily. Pt reports compliance with med and is tolerating well. Her LFTs have normalized on lower statin dose (previously mildly elevated). Discussed adding on additional agent to target LDL goal < 70 however pt does not wish to do this. Reports she hasn't been focusing on her diet or exercise as she should be and prefers to make lifestyle improvements instead. She will continue on her rosuvastatin and plans to have lipids rechecked when she sees Dr Eldridge Dace in August for follow up.

## 2023-05-26 ENCOUNTER — Encounter: Payer: Self-pay | Admitting: Pharmacist

## 2023-05-27 ENCOUNTER — Ambulatory Visit: Admission: RE | Admit: 2023-05-27 | Payer: Medicare Other | Source: Ambulatory Visit

## 2023-05-27 ENCOUNTER — Other Ambulatory Visit: Payer: Self-pay | Admitting: Family Medicine

## 2023-05-27 DIAGNOSIS — Z0000 Encounter for general adult medical examination without abnormal findings: Secondary | ICD-10-CM

## 2023-05-27 DIAGNOSIS — Z1231 Encounter for screening mammogram for malignant neoplasm of breast: Secondary | ICD-10-CM | POA: Diagnosis not present

## 2023-05-28 DIAGNOSIS — M85852 Other specified disorders of bone density and structure, left thigh: Secondary | ICD-10-CM | POA: Diagnosis not present

## 2023-05-28 DIAGNOSIS — Z0000 Encounter for general adult medical examination without abnormal findings: Secondary | ICD-10-CM | POA: Diagnosis not present

## 2023-05-28 DIAGNOSIS — E559 Vitamin D deficiency, unspecified: Secondary | ICD-10-CM | POA: Diagnosis not present

## 2023-05-28 DIAGNOSIS — E7800 Pure hypercholesterolemia, unspecified: Secondary | ICD-10-CM | POA: Diagnosis not present

## 2023-05-28 DIAGNOSIS — Z1211 Encounter for screening for malignant neoplasm of colon: Secondary | ICD-10-CM | POA: Diagnosis not present

## 2023-05-28 DIAGNOSIS — R7303 Prediabetes: Secondary | ICD-10-CM | POA: Diagnosis not present

## 2023-05-28 DIAGNOSIS — E039 Hypothyroidism, unspecified: Secondary | ICD-10-CM | POA: Diagnosis not present

## 2023-06-12 ENCOUNTER — Telehealth: Payer: Self-pay | Admitting: Interventional Cardiology

## 2023-06-12 DIAGNOSIS — E7800 Pure hypercholesterolemia, unspecified: Secondary | ICD-10-CM

## 2023-06-12 NOTE — Telephone Encounter (Signed)
Patient is requesting orders to have lab work prior to 8/29 appointment with Dr. Eldridge Dace.

## 2023-06-12 NOTE — Telephone Encounter (Signed)
Pt advised that I will forward to Dr Erma Pinto to see what lab orders he would like prior to seeing her 07/10/23.

## 2023-06-16 NOTE — Telephone Encounter (Signed)
She needs a CMet, lipids, A1C, CBC.  Not sure if any of these have been done recently. Please check if any done by PCP.

## 2023-06-18 NOTE — Telephone Encounter (Signed)
Spoke with Dr Johnathan Hausen office. Patient had CMET, Thyroid, CBC, A1C, Vitamin D checked on 7/17.  Did not have lipids checked.  Office will fax results.  Patient notified.  She will come in for fasting lipid profile on 8/23

## 2023-06-30 ENCOUNTER — Ambulatory Visit: Payer: Medicare Other | Admitting: Interventional Cardiology

## 2023-07-04 ENCOUNTER — Ambulatory Visit: Payer: Medicare Other | Attending: Interventional Cardiology

## 2023-07-04 DIAGNOSIS — E7800 Pure hypercholesterolemia, unspecified: Secondary | ICD-10-CM

## 2023-07-05 LAB — LIPID PANEL
Chol/HDL Ratio: 2.4 ratio (ref 0.0–4.4)
Cholesterol, Total: 196 mg/dL (ref 100–199)
HDL: 82 mg/dL (ref >39)
LDL Chol Calc (NIH): 92 mg/dL (ref 0–99)
Triglycerides: 130 mg/dL (ref 0–149)
VLDL Cholesterol Cal: 22 mg/dL (ref 5–40)

## 2023-07-09 NOTE — Progress Notes (Unsigned)
Cardiology Office Note   Date:  07/10/2023   ID:  Althia, Fray 01/13/1951, MRN [id number]  PCP:  Noberto Retort, MD    No chief complaint on file.  Hyperlipidemia  Wt Readings from Last 3 Encounters:  07/10/23 151 lb 6.4 oz (68.7 kg)  04/30/22 168 lb (76.2 kg)  06/11/21 170 lb 3.2 oz (77.2 kg)       History of Present Illness: Kathy [name] is a 72 y.o. female  with a history of high cholesterol who has been intolerant of Zetia, Lipitor, Zocor. Crestor alone was tolerated but in combination with Zetia, she had severe stomach upset, vomiting. She has been managed in the lipid clinic.   She has been on Lovaza and Crestor 10 mg.  With 20 mg of Crestor, she had nausea.  She had nausea with Zetia.     Lipids in April 2021 showed a significant increase in her total cholesterol.  Exercise had dropped off in 2020.     Palpitations in 08/2019 prompted trip to ER.   Cholesterol has increased when exercise has decreased in past.  Nausea with Crestor 20 mg daily in the past.She would try 10 mg twice a day if needed. ULtimately tolerated 40 mg daily but had elevated LFTs.  Calcium scoring CT in August 2023 showed: " Severe three-vessel coronary atherosclerotic calcifications. The observed calcium score of 1,279 is at the ninety-seventh percentile for subjects of the same age, gender, and race/ethnicity who are free of clinical cardiovascular disease and treated diabetes. 2.  Aortic Atherosclerosis (ICD10-I70.0). 3. Small hiatal hernia."  Has improved diet in the past year.  She has lost weight. Denies : Chest pain. Dizziness. Leg edema. Nitroglycerin use. Orthopnea. Palpitations. Paroxysmal nocturnal dyspnea. Shortness of breath. Syncope.    Past Medical History:  Diagnosis Date   CKD (chronic kidney disease), stage III ([facility])    Rosacea    Thyroid disease     Past Surgical History:  Procedure Laterality Date   BTL     PILONIDAL CYST / SINUS EXCISION      THYROID SURGERY  2008   partial thyroidectomy     Current Outpatient Medications  Medication Sig Dispense Refill   ALPRAZolam (XANAX) 0.25 MG tablet Take 0.25 mg by mouth every 4 (four) hours as needed for anxiety.     aspirin EC 81 MG tablet Take 1 tablet (81 mg total) by mouth daily. 90 tablet 3   Biotin 1000 MCG tablet Take 1,000 mcg by mouth daily.      Coenzyme Q10 (CO Q 10) 100 MG CAPS Take 100 mg by mouth daily.      levothyroxine (SYNTHROID, LEVOTHROID) 88 MCG tablet Take 88 mcg by mouth daily.     omega-3 acid ethyl esters (LOVAZA) 1 g capsule TAKE 2 CAPSULES TWICE DAILY 360 capsule 3   rosuvastatin (CRESTOR) 20 MG tablet Take 1 tablet (20 mg total) by mouth daily. 90 tablet 3   tretinoin (RETIN-A) 0.05 % cream APPLY TO AFFECTED AREA ON THE SKIN AT BEDTIME.     Vitamin D, Cholecalciferol, 1000 units CAPS Take 1,000 Units by mouth daily.     No current facility-administered medications for this visit.    Allergies:   Lipitor [atorvastatin], Zetia [ezetimibe], and Zocor [simvastatin]    Social History:  The patient  reports that she has never smoked. She has never used smokeless tobacco. She reports current alcohol use. She reports that she does not use drugs.  Family History:  The patient's family history includes AAA (abdominal aortic aneurysm) in her father; Diabetes Mellitus I in her mother; Heart failure in an other family member; Hypercholesterolemia in her father and mother; Prostate cancer in her father; Stroke in an other family member.    ROS:  Please see the history of present illness.   Otherwise, review of systems are positive for intentional weight loss.   All other systems are reviewed and negative.    PHYSICAL EXAM: VS:  BP (!) 102/58   Pulse 73   Ht 5\' 5"  (1.651 m)   Wt 151 lb 6.4 oz (68.7 kg)   SpO2 96%   BMI 25.19 kg/m  , BMI Body mass index is 25.19 kg/m. GEN: Well nourished, well developed, in no acute distress HEENT: normal Neck: no JVD, carotid  bruits, or masses Cardiac: RRR; no murmurs, rubs, or gallops,no edema  Respiratory:  clear to auscultation bilaterally, normal work of breathing GI: soft, nontender, nondistended, + BS MS: no deformity or atrophy Skin: warm and dry, no rash Neuro:  Strength and sensation are intact Psych: euthymic mood, full affect   EKG:   The ekg ordered today demonstrates NSR, poor R wave progression   Recent Labs: 04/15/2023: ALT 26   Lipid Panel    Component Value Date/Time   CHOL 196 07/04/2023 0800   CHOL 216 (H) 09/03/2016 0754   CHOL 214 (H) 03/08/2015 0755   TRIG 130 07/04/2023 0800   TRIG 181 (H) 09/03/2016 0754   TRIG 148 03/08/2015 0755   HDL 82 07/04/2023 0800   HDL 66 09/03/2016 0754   HDL 70 03/08/2015 0755   CHOLHDL 2.4 07/04/2023 0800   CHOLHDL 3.3 09/03/2016 0754   LDLCALC 92 07/04/2023 0800   LDLCALC 120 (H) 09/03/2016 0754   LDLCALC 114 (H) 03/08/2015 0755     Other studies Reviewed: Additional studies/ records that were reviewed today with results demonstrating: labs reviewed.   ASSESSMENT AND PLAN:  Coronary artery calcification: Elevated calcium score greater than thousand.  Need to be aggressive with lifestyle modifications and lipid-lowering therapy. Hyperlipidemia: Followed in the Pharm.D. lipid clinic.  LDL above target.  Repatha was recommended.  She does not want to take Repatha.  Prescribed for her son, but it caused side effects.  She is willing to rosuvastatin take 30 mg daily ; will have her alternate 40 mg and 20 mg daily.  I gave her the name Inclisiran and bempedoic acid to investigate as to whether she would be willing to take. Prediabetes: Whole food, plant-based diet.  High-fiber diet.  Avoid processed foods. History of elevated LFTs back in 2019.  Resolved since then.   Current medicines are reviewed at length with the patient today.  The patient concerns regarding her medicines were addressed.  The following changes have been made:  No  change  Labs/ tests ordered today include: liver lipids in 3 months  Orders Placed This Encounter  Procedures   EKG 12-Lead    Recommend 150 minutes/week of aerobic exercise Low fat, low carb, high fiber diet recommended  Disposition:   FU in 6 months with Dr. Rennis Golden   Signed, Lance Muss, MD  07/10/2023 4:27 PM    [facility] [address] [place], [place], [place]  [address] Phone: [phone]; Fax: [phone]

## 2023-07-10 ENCOUNTER — Ambulatory Visit: Payer: Medicare Other | Attending: Interventional Cardiology | Admitting: Interventional Cardiology

## 2023-07-10 ENCOUNTER — Encounter: Payer: Self-pay | Admitting: Interventional Cardiology

## 2023-07-10 VITALS — BP 102/58 | HR 73 | Ht 65.0 in | Wt 151.4 lb

## 2023-07-10 DIAGNOSIS — R7303 Prediabetes: Secondary | ICD-10-CM | POA: Diagnosis not present

## 2023-07-10 DIAGNOSIS — R7989 Other specified abnormal findings of blood chemistry: Secondary | ICD-10-CM

## 2023-07-10 DIAGNOSIS — E7800 Pure hypercholesterolemia, unspecified: Secondary | ICD-10-CM | POA: Diagnosis not present

## 2023-07-10 DIAGNOSIS — R9431 Abnormal electrocardiogram [ECG] [EKG]: Secondary | ICD-10-CM

## 2023-07-10 MED ORDER — ROSUVASTATIN CALCIUM 20 MG PO TABS: ORAL_TABLET | ORAL | 3 refills | Status: DC

## 2023-07-10 NOTE — Patient Instructions (Addendum)
Medication Instructions:  Your physician has recommended you make the following change in your medication:   1) CHANGE rosuvastatin (Crestor) to alternating 20mg  one day then 40mg  the next day.  Medications discussed at visit to read up on: Inclisiran  Nexlizet  *If you need a refill on your cardiac medications before your next appointment, please call your pharmacy*  Lab Work: In 2-3 months: fasting liver and lipid panel If you have labs (blood work) drawn today and your tests are completely normal, you will receive your results only by: MyChart Message (if you have MyChart) OR A paper copy in the mail If you have any lab test that is abnormal or we need to change your treatment, we will call you to review the results.  Testing/Procedures: None ordered today.  Follow-Up: At [facility], you and your health needs are our priority.  As part of our continuing mission to provide you with exceptional heart care, we have created designated Provider Care Teams.  These Care Teams include your primary Cardiologist (physician) and Advanced Practice Providers (APPs -  Physician Assistants and Nurse Practitioners) who all work together to provide you with the care you need, when you need it.  Your next appointment:   6 month(s)  The format for your next appointment:   In Person  Provider:   Zoila Shutter, MD

## 2023-07-15 ENCOUNTER — Telehealth: Payer: Self-pay | Admitting: Interventional Cardiology

## 2023-07-15 MED ORDER — ROSUVASTATIN CALCIUM 20 MG PO TABS: ORAL_TABLET | ORAL | 3 refills | Status: DC

## 2023-07-15 NOTE — Telephone Encounter (Signed)
Pt's medication was sent to pt's pharmacy as requested. Confirmation received.  °

## 2023-07-15 NOTE — Telephone Encounter (Signed)
*  STAT* If patient is at the pharmacy, call can be transferred to refill team.   1. Which medications need to be refilled? (please list name of each medication and dose if known) rosuvastatin (CRESTOR) 20 MG tablet    2. Would you like to learn more about the convenience, safety, & potential cost savings by using the [facility]? No     3. Are you open to using the [facility] (Type [facility]. No ).   4. Which pharmacy/location (including street and city if local pharmacy) is medication to be sent to? [facility] - [place], [place] - [address] [facility] AT Portal to Registered [facility] Sites    5. Do they need a 30 day or 90 day supply? 90

## 2023-07-21 ENCOUNTER — Other Ambulatory Visit: Payer: Self-pay | Admitting: Interventional Cardiology

## 2023-07-23 DIAGNOSIS — Z1211 Encounter for screening for malignant neoplasm of colon: Secondary | ICD-10-CM | POA: Diagnosis not present

## 2023-07-23 DIAGNOSIS — K648 Other hemorrhoids: Secondary | ICD-10-CM | POA: Diagnosis not present

## 2023-07-29 ENCOUNTER — Ambulatory Visit (INDEPENDENT_AMBULATORY_CARE_PROVIDER_SITE_OTHER): Payer: Medicare Other | Admitting: Nurse Practitioner

## 2023-07-29 ENCOUNTER — Encounter: Payer: Self-pay | Admitting: Nurse Practitioner

## 2023-07-29 VITALS — BP 126/66 | HR 78 | Ht 63.39 in | Wt 155.2 lb

## 2023-07-29 DIAGNOSIS — Z01419 Encounter for gynecological examination (general) (routine) without abnormal findings: Secondary | ICD-10-CM

## 2023-07-29 DIAGNOSIS — M8589 Other specified disorders of bone density and structure, multiple sites: Secondary | ICD-10-CM

## 2023-07-29 DIAGNOSIS — Z780 Asymptomatic menopausal state: Secondary | ICD-10-CM

## 2023-07-29 NOTE — Progress Notes (Signed)
EVANNIE EISELE Mar 02, 1951 [id number]   History:  72 y.o. G2P0002 presents for breast and pelvic exam. No GYN complaints. Postmenopausal - no HRT, no bleeding. Normal pap and mammogram history. HLD, osteopenia, hypothyroidism managed by PCP.   Gynecologic History No LMP recorded. Patient is postmenopausal.   Contraception: post menopausal status  Health Maintenance Last Pap: 07/09/2013 Last mammogram: 05/27/2023. Results were: Normal Last colonoscopy: 2024. Results were: Normal Last Dexa: 11/14/2022. Results were: T-score -2.2, FRAX 21.8% / 5.4%  Past medical history, past surgical history, family history and social history were all reviewed and documented in the EPIC chart. Married. 3 children - 2 here, 1 in [place]. 7 grandchildren.   ROS:  A ROS was performed and pertinent positives and negatives are included.  Exam:  Vitals:   07/29/23 1433  BP: 126/66  Pulse: 78  SpO2: 100%  Weight: 155 lb 3.2 oz (70.4 kg)  Height: 5' 3.39" (1.61 m)    Body mass index is 27.16 kg/m.  General appearance:  Normal Thyroid:  Symmetrical, normal in size, without palpable masses or nodularity. Respiratory  Auscultation:  Clear without wheezing or rhonchi Cardiovascular  Auscultation:  Regular rate, without rubs, murmurs or gallops  Edema/varicosities:  Not grossly evident Abdominal  Soft,nontender, without masses, guarding or rebound.  Liver/spleen:  No organomegaly noted  Hernia:  None appreciated  Skin  Inspection:  Grossly normal Breasts: Examined lying and sitting.   Right: Without masses, retractions, nipple discharge or axillary adenopathy.   Left: Without masses, retractions, nipple discharge or axillary adenopathy. Pelvic: External genitalia:  no lesions              Urethra:  normal appearing urethra with no masses, tenderness or lesions              Bartholins and Skenes: normal                 Vagina: normal appearing vagina with normal color and discharge, no lesions.  Atrophic changes              Cervix: no lesions Bimanual Exam:  Uterus:  no masses or tenderness              Adnexa: no mass, fullness, tenderness              Rectovaginal: Deferred              Anus:  normal sphincter tone, no lesions  Patient informed chaperone available to be present for breast and pelvic exam. Patient has requested no chaperone to be present. Patient has been advised what will be completed during breast and pelvic exam.   Assessment/Plan:  72 y.o. G2P0002 for breast and pelvic exam.   Well female exam with routine gynecological exam - Education provided on SBEs, importance of preventative screenings, current guidelines, high calcium diet, regular exercise, and multivitamin daily. Labs with PCP.   Postmenopausal - no HRT, no bleeding.   Osteopenia of multiple sites - 11/2022 T-score -2.2 with elevated FRAX. PCP manages this. Discussed DXA and increased risk for fractures. Not interested in medication management. Taking daily Vitamin D supplement.   Screening for cervical cancer - Normal Pap history. No longer screening per guidelines.  Screening for breast cancer - Normal mammogram history.  Continue annual screenings.  Normal breast exam today.  Screening for colon cancer - 2024 colonoscopy. No longer screening per GI recommendation.    Return in 2 years for breast and pelvic exam.  Olivia Mackie DNP, 3:00 PM 07/29/2023

## 2023-08-27 DIAGNOSIS — R1032 Left lower quadrant pain: Secondary | ICD-10-CM | POA: Diagnosis not present

## 2023-08-30 ENCOUNTER — Emergency Department (HOSPITAL_BASED_OUTPATIENT_CLINIC_OR_DEPARTMENT_OTHER): Payer: Medicare Other | Admitting: Radiology

## 2023-08-30 ENCOUNTER — Encounter (HOSPITAL_BASED_OUTPATIENT_CLINIC_OR_DEPARTMENT_OTHER): Payer: Self-pay

## 2023-08-30 ENCOUNTER — Emergency Department (HOSPITAL_BASED_OUTPATIENT_CLINIC_OR_DEPARTMENT_OTHER)
Admission: EM | Admit: 2023-08-30 | Discharge: 2023-08-30 | Disposition: A | Discharge status: Home / Self Care | Payer: Medicare Other | Attending: Emergency Medicine | Admitting: Emergency Medicine

## 2023-08-30 ENCOUNTER — Other Ambulatory Visit (HOSPITAL_BASED_OUTPATIENT_CLINIC_OR_DEPARTMENT_OTHER): Payer: Self-pay

## 2023-08-30 DIAGNOSIS — N183 Chronic kidney disease, stage 3 unspecified: Secondary | ICD-10-CM | POA: Diagnosis not present

## 2023-08-30 DIAGNOSIS — M1612 Unilateral primary osteoarthritis, left hip: Secondary | ICD-10-CM | POA: Diagnosis not present

## 2023-08-30 DIAGNOSIS — Z7982 Long term (current) use of aspirin: Secondary | ICD-10-CM | POA: Diagnosis not present

## 2023-08-30 DIAGNOSIS — M47817 Spondylosis without myelopathy or radiculopathy, lumbosacral region: Secondary | ICD-10-CM | POA: Diagnosis not present

## 2023-08-30 DIAGNOSIS — M51379 Other intervertebral disc degeneration, lumbosacral region without mention of lumbar back pain or lower extremity pain: Secondary | ICD-10-CM | POA: Diagnosis not present

## 2023-08-30 DIAGNOSIS — M160 Bilateral primary osteoarthritis of hip: Secondary | ICD-10-CM | POA: Diagnosis not present

## 2023-08-30 DIAGNOSIS — M5136 Other intervertebral disc degeneration, lumbar region with discogenic back pain only: Secondary | ICD-10-CM | POA: Diagnosis not present

## 2023-08-30 DIAGNOSIS — M25552 Pain in left hip: Secondary | ICD-10-CM | POA: Diagnosis not present

## 2023-08-30 DIAGNOSIS — M438X6 Other specified deforming dorsopathies, lumbar region: Secondary | ICD-10-CM | POA: Diagnosis not present

## 2023-08-30 MED ORDER — CYCLOBENZAPRINE HCL 5 MG PO TABS
5.0000 mg | ORAL_TABLET | Freq: Three times a day (TID) | ORAL | 0 refills | Status: AC | PRN
  Filled 2023-08-30: qty 20, 4d supply, fill #0

## 2023-08-30 MED ORDER — CYCLOBENZAPRINE HCL 5 MG PO TABS
5.0000 mg | ORAL_TABLET | Freq: Three times a day (TID) | ORAL | 0 refills | Status: DC | PRN
Start: 2023-08-30 — End: 2023-08-30

## 2023-08-30 NOTE — ED Notes (Signed)
Dc instructions reviewed with patient. Patient voiced understanding. Dc with belongings.  °

## 2023-08-30 NOTE — ED Triage Notes (Signed)
She c/o non-traumatic left hip pain (greater trochanter area) for just over a week now. She is ambulatory and in no distress.

## 2023-08-30 NOTE — Discharge Instructions (Addendum)
For your pain, you may take up to 1000mg of acetaminophen (tylenol) 4 times daily for up to a week. This is the maximum dose of acetminophen (tylenol) you can take from all sources. Please check other over-the-counter medications and prescriptions to ensure you are not taking other medications that contain acetaminophen.  You may also take ibuprofen 400 mg 6 times a day OR 600mg 4 times a day alternating with or at the same time as tylenol.   

## 2023-08-30 NOTE — ED Provider Notes (Signed)
[place] EMER[facility]ENC[facility] DEPARTMENT AT [facility] Provider Note   CSN: [id number] Arrival date & time: 08/30/23  1003     History {Add pertinent medical, surgical, social history, OB history to HPI:1} Chief Complaint  Patient presents with   left hip pain    Kathy [name] is a 72 y.o. female.  HPI       Left sided pain Was lifting, then on Monday really started and was worse Ibuprofen seems to help Is located left over pelvis, some radiation into the socket Feels like an achy feeling all the time but standing and turing it is worse, on some days it is 10/10. Thursday better than today was worse again.   Does radiate some to the back but not all the time, especially with walking No numbness, weakness, no loss control bowel or bladder No fever, hx of IVDU No falls or trauma Not abdominal pain now, did have some pelvic pain at first Monday but the resolved No urinary symptoms, constipation or diarrhea    Past Medical History:  Diagnosis Date   CKD (chronic kidney disease), stage III (HCC)    Rosacea    Thyroid disease     Past Surgical History:  Procedure Laterality Date   BTL     PILONIDAL CYST / SINUS EXCISION     THYROID SURGERY  2008   partial thyroidectomy     Home Medications Prior to Admission medications   Medication Sig Start Date End Date Taking? Authorizing Provider  ALPRAZolam (XANAX) 0.25 MG tablet Take 0.25 mg by mouth every 4 (four) hours as needed for anxiety.    [provider]  aspirin EC 81 MG tablet Take 1 tablet (81 mg total) by mouth daily. 02/23/19   Corky Crafts, MD  Biotin 1000 MCG tablet Take 1,000 mcg by mouth daily.     [provider]  Coenzyme Q10 (CO Q 10) 100 MG CAPS Take 100 mg by mouth daily.     [provider]  levothyroxine (SYNTHROID, LEVOTHROID) 88 MCG tablet Take 88 mcg by mouth daily.    [provider]  omega-3 acid ethyl esters (LOVAZA) 1 g capsule TAKE 2 CAPSULES TWICE  DAILY 07/22/23   Corky Crafts, MD  rosuvastatin (CRESTOR) 20 MG tablet Take 1 tablet (20 mg total) by mouth every other day and on the alternate days take 2 tablets (40 mg total). 07/15/23   Corky Crafts, MD  tretinoin (RETIN-A) 0.05 % cream APPLY TO AFFECTED AREA ON THE SKIN AT BEDTIME. 02/09/20   [provider]  Vitamin D, Cholecalciferol, 1000 units CAPS Take 1,000 Units by mouth daily.    [provider]      Allergies    Lipitor [atorvastatin], Zetia [ezetimibe], and Zocor [simvastatin]    Review of Systems   Review of Systems  Physical Exam Updated Vital Signs BP (!) 153/74   Pulse 67   Temp 98.6 F (37 C) (Oral)   Resp 16   SpO2 98%  Physical Exam  ED Results / Procedures / Treatments   Labs (all labs ordered are listed, but only abnormal results are displayed) Labs Reviewed - No data to display  EKG None  Radiology DG Lumbar Spine Complete  Result Date: 08/30/2023 CLINICAL DATA:  Left hip pain. EXAM: LUMBAR SPINE - COMPLETE 5 VIEW COMPARISON:  None Available. FINDINGS: Five non rib-bearing lumbar type vertebral bodies are present. Heights normal. Mild straightening normal lumbar lordosis is present. Levoconvex curvature is  centered at L4. Asymmetric degenerative changes at L5-S1 may impact the left hip. IMPRESSION: 1. Levoconvex curvature of the lumbar spine. 2. Asymmetric degenerative changes at L5-S1 may impact the left hip. Electronically Signed   By: Marin Roberts M.D.   On: 08/30/2023 13:12   DG Hip Unilat W or Wo Pelvis 2-3 Views Left  Result Date: 08/30/2023 CLINICAL DATA:  72 year old female with history of left-sided hip pain. EXAM: DG HIP (WITH OR WITHOUT PELVIS) 2-3V LEFT COMPARISON:  No priors. FINDINGS: Three views of the bony pelvis in the left hip demonstrate no acute displaced fractures of the bony pelvic ring. Bilateral proximal femurs as visualized appear intact, and the left femoral head is properly located. Joint  space narrowing, subchondral sclerosis, subchondral cyst formation and osteophyte formation are noted in both hip joints, indicative of moderate osteoarthritis. IMPRESSION: 1. No acute radiographic abnormality of the bony pelvis or the left hip. 2. Moderate bilateral hip joint osteoarthritis. Electronically Signed   By: Trudie Reed M.D.   On: 08/30/2023 13:09    Procedures Procedures  {Document cardiac monitor, telemetry assessment procedure when appropriate:1}  Medications Ordered in ED Medications - No data to display  ED Course/ Medical Decision Making/ A&P   {   Click here for ABCD2, HEART and other calculatorsREFRESH Note before signing :1}                              Medical Decision Making Amount and/or Complexity of Data Reviewed Radiology: ordered.   ***  {Document critical care time when appropriate:1} {Document review of labs and clinical decision tools ie heart score, Chads2Vasc2 etc:1}  {Document your independent review of radiology images, and any outside records:1} {Document your discussion with family members, caretakers, and with consultants:1} {Document social determinants of health affecting pt's care:1} {Document your decision making why or why not admission, treatments were needed:1} Final Clinical Impression(s) / ED Diagnoses Final diagnoses:  None    Rx / DC Orders ED Discharge Orders     None

## 2023-10-20 ENCOUNTER — Ambulatory Visit: Payer: Medicare Other

## 2023-10-20 DIAGNOSIS — R7303 Prediabetes: Secondary | ICD-10-CM

## 2023-10-20 DIAGNOSIS — E7800 Pure hypercholesterolemia, unspecified: Secondary | ICD-10-CM

## 2023-10-20 DIAGNOSIS — R9431 Abnormal electrocardiogram [ECG] [EKG]: Secondary | ICD-10-CM

## 2023-10-20 DIAGNOSIS — R7989 Other specified abnormal findings of blood chemistry: Secondary | ICD-10-CM

## 2023-10-30 DIAGNOSIS — Z23 Encounter for immunization: Secondary | ICD-10-CM | POA: Diagnosis not present

## 2024-01-11 NOTE — Progress Notes (Unsigned)
 Cardiology Office Note    Date:  01/13/2024  ID:  ZYAIR RHEIN, DOB 1951-10-03, MRN [id number] PCP:  Noberto Retort, MD  Cardiologist:  Lance Muss, MD  Electrophysiologist:  None   Chief Complaint: Hyperlipidemia   History of Present Illness: .    Kathy [name] is a 73 y.o. female with visit-pertinent history of hyperlipidemia, palpitations, elevated calcium score greater than thousand, prediabetes.  Kathy [name] has previously been followed by Dr. Eldridge Dace.  She has been followed by the lipid clinic for history of high cholesterol with intolerance of Zetia, Lipitor, Zocor.  In August 2023 she had calcium scoring that indicated severe three-vessel coronary atherosclerotic calcifications, calcium score of 1279 which was at the 97th percentile, aortic atherosclerosis.  She was last seen in clinic on 07/10/2023.  She remained stable from a cardiac standpoint.  Today she presents for follow-up.  She reports that she has been doing well.  She denies chest pain, shortness of breath, lower extremity edema, palpitations, orthopnea or PND.  She reports she presented today in order to talk about her cholesterol, she notes that Dr. Eldridge Dace had previously recommended referral to Dr. Rennis Golden for ongoing lipid management.  She has been unable to tolerate higher dose statin medications and has declined PCSK9 inhibitors she notes that her son has previously tried them and had numerous side effects.  She reports that she has not been exercising as much recently and her diet has overall not been great, she is currently working to improve this.  She notes that her last office visit Dr. Jiles Garter was trying to alternate 20 and 40 mg of rosuvastatin, she notes she was unable to tolerate 40 mg in setting of increased nausea.   ROS: .   Today she denies chest pain, shortness of breath, lower extremity edema, fatigue, palpitations, melena, hematuria, hemoptysis, diaphoresis, weakness, presyncope, syncope,  orthopnea, and PND.  All other systems are reviewed and otherwise negative. Studies Reviewed: [name]    EKG:  EKG is ordered today, personally reviewed, demonstrating  EKG Interpretation Date/Time:  Tuesday January 13 2024 09:17:48 EST Ventricular Rate:  63 PR Interval:  158 QRS Duration:  80 QT Interval:  416 QTC Calculation: 425 R Axis:   -47  Text Interpretation: Normal sinus rhythm Low voltage QRS Left anterior fascicular block No significant change from prior tracings   Confirmed by Reather Littler [phone]) on 01/13/2024 9:42:14 AM   CV Studies:  CT cardiac scoring on 06/17/2022: Severe three-vessel coronary atherosclerotic calcification, calcium score 1279, 97th percentile for age, gender and race matched controls.  Current Reported Medications:.    Current Meds  Medication Sig   aspirin EC 81 MG tablet Take 1 tablet (81 mg total) by mouth daily.   Biotin 1000 MCG tablet Take 1,000 mcg by mouth daily.    Blood Pressure Monitoring (BLOOD PRESSURE DIGITAL SOLN) KIT Apply topically once a week.   Coenzyme Q10 (CO Q 10) 100 MG CAPS Take 100 mg by mouth daily.    levothyroxine (SYNTHROID, LEVOTHROID) 88 MCG tablet Take 88 mcg by mouth daily.   omega-3 acid ethyl esters (LOVAZA) 1 g capsule TAKE 2 CAPSULES TWICE DAILY   rosuvastatin (CRESTOR) 20 MG tablet Take 1 tablet (20 mg total) by mouth every other day and on the alternate days take 2 tablets (40 mg total).   tretinoin (RETIN-A) 0.05 % cream APPLY TO AFFECTED AREA ON THE SKIN AT BEDTIME.   Vitamin D, Cholecalciferol, 1000 units CAPS Take 1,000 Units  by mouth daily.    Physical Exam:    VS:  BP 128/76   Pulse 63   Ht 5\' 4"  (1.626 m)   Wt 158 lb (71.7 kg)   SpO2 97%   BMI 27.12 kg/m    Wt Readings from Last 3 Encounters:  01/13/24 158 lb (71.7 kg)  07/29/23 155 lb 3.2 oz (70.4 kg)  07/10/23 151 lb 6.4 oz (68.7 kg)    GEN: Well nourished, well developed in no acute distress NECK: No JVD; No carotid bruits CARDIAC: RRR, no  murmurs, rubs, gallops RESPIRATORY:  Clear to auscultation without rales, wheezing or rhonchi  ABDOMEN: Soft, non-tender, non-distended EXTREMITIES:  No edema; No acute deformity   Asessement and Plan:.    Coronary artery calcification: Patient with elevated calcium score of 1279, 97th percentile in 06/2022. Stable with no anginal symptoms. No indication for ischemic evaluation.  Heart healthy diet and regular cardiovascular exercise encouraged.  Reviewed ED precautions.  Continue aspirin 81 mg daily, Crestor 20 mg daily.  Hyperlipidemia: Patient with intolerance to multiple statins and Zetia.  Her son has previously taken Repatha and experienced side effects. Patient's last lipid profile on 07/04/2023 indicated total cholesterol 196, HDL 82, triglycerides 130 and LDL 92.  She was instructed to alternate rosuvastatin 20 mg and 40 mg, she was unable to tolerate 40 mg of rosuvastatin for increased nausea.  She has history of elevated LFTs with high intensity statins. Patient notes she had previously been told she was going to be referred to Dr. Rennis Golden for ongoing lipid management, does not appear referral was placed.  Check fasting lipid profile, lipoprotein a and LFTs today.  Will refer patient to Dr. Rennis Golden.  Prediabetes: High-fiber and plant-based diet encouraged.  Patient encouraged to increase her exercise as tolerated    Disposition: F/u with Dr. Val EagleJennette Kettle in six months to establish care.   Signed, Rip Harbour, NP

## 2024-01-12 ENCOUNTER — Other Ambulatory Visit: Payer: Self-pay | Admitting: Interventional Cardiology

## 2024-01-13 ENCOUNTER — Ambulatory Visit: Payer: Medicare Other | Attending: Cardiology | Admitting: Cardiology

## 2024-01-13 ENCOUNTER — Encounter: Payer: Self-pay | Admitting: Cardiology

## 2024-01-13 VITALS — BP 128/76 | HR 63 | Ht 64.0 in | Wt 158.0 lb

## 2024-01-13 DIAGNOSIS — R931 Abnormal findings on diagnostic imaging of heart and coronary circulation: Secondary | ICD-10-CM | POA: Diagnosis not present

## 2024-01-13 DIAGNOSIS — R7303 Prediabetes: Secondary | ICD-10-CM | POA: Diagnosis present

## 2024-01-13 DIAGNOSIS — R7989 Other specified abnormal findings of blood chemistry: Secondary | ICD-10-CM | POA: Diagnosis present

## 2024-01-13 DIAGNOSIS — E7800 Pure hypercholesterolemia, unspecified: Secondary | ICD-10-CM | POA: Diagnosis present

## 2024-01-13 NOTE — Patient Instructions (Addendum)
 Medication Instructions:  No medication changes were made during today's visit. *If you need a refill on your cardiac medications before your next appointment, please call your pharmacy*   Lab Work: Fasting labs will be drawn today. If you have labs (blood work) drawn today and your tests are completely normal, you will receive your results only by: MyChart Message (if you have MyChart) OR A paper copy in the mail If you have any lab test that is abnormal or we need to change your treatment, we will call you to review the results.   Testing/Procedures: No procedures ordered today.    Follow-Up: At [facility], you and your health needs are our priority.  As part of our continuing mission to provide you with exceptional heart care, we have created designated Provider Care Teams.  These Care Teams include your primary Cardiologist (physician) and Advanced Practice Providers (APPs -  Physician Assistants and Nurse Practitioners) who all work together to provide you with the care you need, when you need it.  We recommend signing up for the patient portal called "MyChart".  Sign up information is provided on this After Visit Summary.  MyChart is used to connect with patients for Virtual Visits (Telemedicine).  Patients are able to view lab/test results, encounter notes, upcoming appointments, etc.  Non-urgent messages can be sent to your provider as well.   To learn more about what you can do with MyChart, go to [email or web address].    Your next appointment:   6 month(s)  Provider:   Dr. Lennie Odor     Other Instructions        1st Floor: - Lobby - Registration  - Pharmacy  - Lab - Cafe   2nd Floor: - PV Lab - Diagnostic Testing (echo, CT, nuclear med)   3rd Floor: - Vacant   4th Floor: - TCTS (cardiothoracic surgery) - AFib Clinic - Structural Heart Clinic - Vascular Surgery  - Vascular Ultrasound   5th Floor: - [facility] (general and  EP) - Clinical Pharmacy for coumadin, hypertension, lipid, weight-loss medications, and med management appointments      Valet parking services will be available as well.

## 2024-01-14 LAB — LIPID PANEL
Chol/HDL Ratio: 2.3 ratio (ref 0.0–4.4)
Cholesterol, Total: 196 mg/dL (ref 100–199)
HDL: 87 mg/dL (ref >39)
LDL Chol Calc (NIH): 90 mg/dL (ref 0–99)
Triglycerides: 112 mg/dL (ref 0–149)
VLDL Cholesterol Cal: 19 mg/dL (ref 5–40)

## 2024-01-14 LAB — HEPATIC FUNCTION PANEL
ALT: 26 IU/L (ref 0–32)
AST: 36 IU/L (ref 0–40)
Albumin: 4.4 g/dL (ref 3.8–4.8)
Alkaline Phosphatase: 97 IU/L (ref 44–121)
Bilirubin Total: 0.5 mg/dL (ref 0.0–1.2)
Bilirubin, Direct: 0.18 mg/dL (ref 0.00–0.40)
Total Protein: 6.9 g/dL (ref 6.0–8.5)

## 2024-01-14 LAB — LIPOPROTEIN A (LPA): Lipoprotein (a): 188.8 nmol/L — ABNORMAL HIGH (ref <75.0)

## 2024-01-19 ENCOUNTER — Telehealth: Payer: Self-pay

## 2024-01-19 NOTE — Telephone Encounter (Signed)
-----   Message from Rip Harbour sent at 01/15/2024  8:02 AM EST ----- Please let Kathy [name] know that her LDL does remain above goal, her liver function is normal. Continue with plan of establishing with Dr. Rennis Golden for lipid clinic.  Continue current medications.

## 2024-01-19 NOTE — Telephone Encounter (Signed)
 Called patient advised of below they verbalized understanding.

## 2024-05-19 ENCOUNTER — Encounter (HOSPITAL_BASED_OUTPATIENT_CLINIC_OR_DEPARTMENT_OTHER): Payer: Self-pay | Admitting: Internal Medicine

## 2024-05-19 ENCOUNTER — Other Ambulatory Visit (HOSPITAL_COMMUNITY): Payer: Self-pay

## 2024-05-19 ENCOUNTER — Ambulatory Visit (INDEPENDENT_AMBULATORY_CARE_PROVIDER_SITE_OTHER): Admitting: Internal Medicine

## 2024-05-19 ENCOUNTER — Telehealth: Payer: Self-pay | Admitting: Pharmacy Technician

## 2024-05-19 VITALS — BP 143/82 | HR 68 | Ht 64.0 in | Wt 158.0 lb

## 2024-05-19 DIAGNOSIS — E785 Hyperlipidemia, unspecified: Secondary | ICD-10-CM

## 2024-05-19 DIAGNOSIS — R7989 Other specified abnormal findings of blood chemistry: Secondary | ICD-10-CM | POA: Diagnosis not present

## 2024-05-19 DIAGNOSIS — I700 Atherosclerosis of aorta: Secondary | ICD-10-CM | POA: Diagnosis not present

## 2024-05-19 DIAGNOSIS — R931 Abnormal findings on diagnostic imaging of heart and coronary circulation: Secondary | ICD-10-CM | POA: Diagnosis not present

## 2024-05-19 MED ORDER — REPATHA SURECLICK 140 MG/ML ~~LOC~~ SOAJ: 140.0000 mg | SUBCUTANEOUS | 3 refills | Status: DC

## 2024-05-19 NOTE — Progress Notes (Signed)
 LIPID CLINIC CONSULT NOTE  Chief Complaint:  Manage dyslipidemia  Primary Care Physician: Kathy Elsie SAUNDERS, MD  Primary Cardiologist:  Kathy Reek, MD  HPI:  Kathy [name] is a 73 y.o. female who is being seen today for the evaluation of dyslipidemia at the request of West, Katlyn D, NP.  This is a pleasant 73 year old female kindly referred for evaluation management of dyslipidemia.  She was previously followed by Dr. Reek for history of hyperlipidemia, palpitations and high coronary calcium  score.  In 2023 she had a calcium  score that showed three-vessel coronary artery disease with a calcium  score of 1279, 97th percentile.  Aortic atherosclerosis was also noted.  She does note history of high cholesterol in her father as well as abdominal aneurysm, but otherwise noted that the women in her family have lived to be into their 39s.  She has been on a number of statins in the past but has had intolerances including Zetia, atorvastatin and simvastatin.  More recently she has been tolerant of rosuvastatin  but only 20 mg.  On alternating doses with the 40 mg she notes side effects.  Recent lipid showed total cholesterol 196, triglycerides 112, HDL 87 and LDL 90.  She did have testing for LP(a) which was elevated 188.8 nmol/L.  She also has a history of mild liver enzyme elevation in the past however recent AST and ALT were normal at 36/26 respectively.  She does have hesitancy about adding medications for further lipid lowering and notes that her son was on Repatha  but has side effects including fatigue with it.  PMHx:  Past Medical History:  Diagnosis Date   CKD (chronic kidney disease), stage III (HCC)    Rosacea    Thyroid  disease     Past Surgical History:  Procedure Laterality Date   BTL     PILONIDAL CYST / SINUS EXCISION     THYROID  SURGERY  2008   partial thyroidectomy    FAMHx:  Family History  Problem Relation Age of Onset   Hypercholesterolemia Mother     Diabetes Mellitus I Mother    Prostate cancer Father    Hypercholesterolemia Father    AAA (abdominal aortic aneurysm) Father    Stroke Other    Heart failure Other    Breast cancer Neg Hx     SOCHx:   reports that she has never smoked. She has never used smokeless tobacco. She reports current alcohol use. She reports that she does not use drugs.  ALLERGIES:  Allergies  Allergen Reactions   Lipitor [Atorvastatin]     Severe muscle aches   Zetia [Ezetimibe]     GI upset   Zocor [Simvastatin]     Severe muscle aches    ROS: Pertinent items noted in HPI and remainder of comprehensive ROS otherwise negative.  HOME MEDS: Current Outpatient Medications on File Prior to Visit  Medication Sig Dispense Refill   aspirin  EC 81 MG tablet Take 1 tablet (81 mg total) by mouth daily. 90 tablet 3   Biotin 1000 MCG tablet Take 1,000 mcg by mouth daily.      Blood Pressure Monitoring (BLOOD PRESSURE DIGITAL SOLN) KIT Apply topically once a week.     Coenzyme Q10 (CO Q 10) 100 MG CAPS Take 100 mg by mouth daily.      levothyroxine (SYNTHROID, LEVOTHROID) 88 MCG tablet Take 88 mcg by mouth daily.     omega-3 acid ethyl esters (LOVAZA ) 1 g capsule TAKE 2 CAPSULES TWICE DAILY  360 capsule 3   rosuvastatin  (CRESTOR ) 20 MG tablet TAKE 1 TABLET DAILY 90 tablet 3   tretinoin (RETIN-A) 0.05 % cream APPLY TO AFFECTED AREA ON THE SKIN AT BEDTIME.     Vitamin [name], Cholecalciferol, 1000 units CAPS Take 1,000 Units by mouth daily.     ALPRAZolam (XANAX) 0.25 MG tablet Take 0.25 mg by mouth every 4 (four) hours as needed for anxiety.     cyclobenzaprine  (FLEXERIL ) 5 MG tablet Take 1-2 tablets (5-10 mg total) by mouth 3 (three) times daily as needed for muscle spasms. 20 tablet 0   No current facility-administered medications on file prior to visit.    LABS/IMAGING: No results found for this or any previous visit (from the past 48 hours). No results found.  LIPID PANEL:    Component Value Date/Time    CHOL 196 01/13/2024 0954   CHOL 216 (H) 09/03/2016 0754   CHOL 214 (H) 03/08/2015 0755   TRIG 112 01/13/2024 0954   TRIG 181 (H) 09/03/2016 0754   TRIG 148 03/08/2015 0755   HDL 87 01/13/2024 0954   HDL 66 09/03/2016 0754   HDL 70 03/08/2015 0755   CHOLHDL 2.3 01/13/2024 0954   CHOLHDL 3.3 09/03/2016 0754   LDLCALC 90 01/13/2024 0954   LDLCALC 120 (H) 09/03/2016 0754   LDLCALC 114 (H) 03/08/2015 0755    Lipoprotein (a)  Date/Time Value Ref Range Status  01/13/2024 09:54 AM 188.8 (H) <75.0 nmol/L Final    Comment:    Note:  Values greater than or equal to 75.0 nmol/L may        indicate an independent risk factor for CHD,        but must be evaluated with caution when applied        to non-Caucasian populations due to the        influence of genetic factors on Lp(a) across        ethnicities.      WEIGHTS: Wt Readings from Last 3 Encounters:  05/19/24 158 lb (71.7 kg)  01/13/24 158 lb (71.7 kg)  07/29/23 155 lb 3.2 oz (70.4 kg)    VITALS: BP (!) 143/82 (BP Location: Left Arm, Patient Position: Sitting, Cuff Size: Normal)   Pulse 68   Ht 5' 4 (1.626 m)   Wt 158 lb (71.7 kg)   SpO2 95%   BMI 27.12 kg/m   EXAM: Deferred  EKG: Deferred  ASSESSMENT: Mixed dyslipidemia, very high risk, goal LDL less than 55 CAC score of 1279, left main and three-vessel coronary calcification, 97th percentile (2023) Aortic atherosclerosis Relative statin and ezetimibe intolerance-able to take rosuvastatin  20 mg daily History of mild liver enzyme elevation Elevated LP(a)-188.8 nmol/L  PLAN: 1.   Ms. Kathy [name] has a mixed dyslipidemia and is considered very high risk given a high calcium  score and three-vessel coronary calcification.  I would recommend a lower target LDL less than 55.  She is already on high intensity statin therapy but cannot tolerate more than 20 mg rosuvastatin .  She also has a high LP(a) and would benefit from a PCSK9 inhibitor.  She does have some hesitancy about  using this mostly based on some side effects her son's had as well as adding additional medications and concerned about potential side effects however I think she will probably do very well with it.  Concern best option to help lower her LP(a) as well.  We also discussed Livalo as an option.  She does have a Financial planner  which may cover the balance that Medicare does not cover regarding that medication and it does probably have less side effects than Repatha  with less frequent dosing however she is concerned that the medicine has not been on the market for very long.  Based on that we will pursue Repatha  140 mg every 2 weeks.  She was educated on injections and potential side effects of the medications.  Will plan repeat lipids including NMR and LP(a) in about 3 to 4 months.  Follow-up with me as needed.  Kathy KYM Maxcy, MD, [facility], FNLA, FACP  [place]  [facility]  Medical Director of the [facility]  [facility] Diplomate of the American Board of Clinical Lipidology Attending Cardiologist  Direct Dial: [phone]  Fax: [phone]  Website:  [email or web address] Kathy [name] 05/19/2024, 10:44 AM

## 2024-05-19 NOTE — Patient Instructions (Signed)
 Medication Instructions:  Your physician has recommended you make the following change in your medication:   Start: Repatha  injection once every two weeks    Labwork: FASTING NMR & LPA IN 3 MONTHS   Follow-Up: WE WILL CALL WITH LAB RESULTS TO DETERMINE FOLLOW UP    Special Instructions:

## 2024-05-21 ENCOUNTER — Other Ambulatory Visit: Payer: Self-pay | Admitting: Family Medicine

## 2024-05-21 DIAGNOSIS — Z1231 Encounter for screening mammogram for malignant neoplasm of breast: Secondary | ICD-10-CM

## 2024-05-22 ENCOUNTER — Encounter (HOSPITAL_BASED_OUTPATIENT_CLINIC_OR_DEPARTMENT_OTHER): Payer: Self-pay | Admitting: Internal Medicine

## 2024-05-24 ENCOUNTER — Other Ambulatory Visit (HOSPITAL_BASED_OUTPATIENT_CLINIC_OR_DEPARTMENT_OTHER): Payer: Self-pay | Admitting: *Deleted

## 2024-05-24 MED ORDER — REPATHA SURECLICK 140 MG/ML ~~LOC~~ SOAJ: 140.0000 mg | SUBCUTANEOUS | 3 refills | Status: AC

## 2024-06-01 ENCOUNTER — Ambulatory Visit
Admission: RE | Admit: 2024-06-01 | Discharge: 2024-06-01 | Disposition: A | Discharge status: Home / Self Care | Source: Ambulatory Visit

## 2024-06-01 DIAGNOSIS — Z1231 Encounter for screening mammogram for malignant neoplasm of breast: Secondary | ICD-10-CM

## 2024-09-08 ENCOUNTER — Other Ambulatory Visit: Payer: Self-pay | Admitting: Interventional Cardiology

## 2024-09-27 ENCOUNTER — Encounter: Payer: Self-pay | Admitting: *Deleted

## 2024-09-27 DIAGNOSIS — Z006 Encounter for examination for normal comparison and control in clinical research program: Secondary | ICD-10-CM

## 2024-09-27 NOTE — Research (Signed)
 Message left to see if she has any questions about the pre-event research study. Encouraged her to call back with any questions or if interested in the study.

## 2024-11-08 ENCOUNTER — Encounter (HOSPITAL_BASED_OUTPATIENT_CLINIC_OR_DEPARTMENT_OTHER): Payer: Self-pay | Admitting: Internal Medicine

## 2024-11-10 ENCOUNTER — Other Ambulatory Visit: Payer: Self-pay | Admitting: Internal Medicine

## 2024-11-11 LAB — NMR, LIPOPROFILE
Cholesterol, Total: 140 mg/dL (ref 100–199)
HDL Particle Number: 52.1 umol/L
HDL-C: 76 mg/dL
LDL Particle Number: 435 nmol/L
LDL Size: 20.8 nm
LDL-C (NIH Calc): 40 mg/dL (ref 0–99)
LP-IR Score: 42
Small LDL Particle Number: 205 nmol/L
Triglycerides: 142 mg/dL (ref 0–149)

## 2024-11-11 LAB — LIPOPROTEIN A (LPA): Lipoprotein (a): 183.2 nmol/L — ABNORMAL HIGH

## 2024-11-16 ENCOUNTER — Ambulatory Visit: Payer: Self-pay | Admitting: Internal Medicine

## 2024-12-14 ENCOUNTER — Other Ambulatory Visit: Payer: Self-pay | Admitting: Cardiology

## 2024-12-30 ENCOUNTER — Ambulatory Visit: Admitting: Cardiovascular Disease
